# Patient Record
Sex: Male | Born: 1965
Health system: Southern US, Community
[De-identification: ages and names within clinical notes are randomized; demographics above are authoritative.]

## PROBLEM LIST (undated history)

## (undated) DIAGNOSIS — K219 Gastro-esophageal reflux disease without esophagitis: Secondary | ICD-10-CM

## (undated) DIAGNOSIS — M25339 Other instability, unspecified wrist: Secondary | ICD-10-CM

## (undated) DIAGNOSIS — Z87448 Personal history of other diseases of urinary system: Secondary | ICD-10-CM

## (undated) DIAGNOSIS — L290 Pruritus ani: Secondary | ICD-10-CM

## (undated) HISTORY — DX: Gastro-esophageal reflux disease without esophagitis: K21.9

## (undated) HISTORY — PX: LAPAROSCOPIC CHOLECYSTECTOMY: SUR755

## (undated) HISTORY — DX: Other instability, unspecified wrist: M25.339

## (undated) HISTORY — DX: Pruritus ani: L29.0

## (undated) HISTORY — DX: Personal history of other diseases of urinary system: Z87.448

---

## 2008-09-08 HISTORY — PX: RECTAL BOTOX INJECTION: SHX2304

## 2009-01-15 ENCOUNTER — Ambulatory Visit: Payer: Self-pay | Admitting: Family Medicine

## 2009-01-15 ENCOUNTER — Telehealth: Payer: Self-pay | Admitting: Family Medicine

## 2009-01-15 DIAGNOSIS — J069 Acute upper respiratory infection, unspecified: Secondary | ICD-10-CM | POA: Insufficient documentation

## 2009-01-15 DIAGNOSIS — K602 Anal fissure, unspecified: Secondary | ICD-10-CM | POA: Insufficient documentation

## 2009-01-15 DIAGNOSIS — K6289 Other specified diseases of anus and rectum: Secondary | ICD-10-CM | POA: Insufficient documentation

## 2009-01-15 DIAGNOSIS — R21 Rash and other nonspecific skin eruption: Secondary | ICD-10-CM | POA: Insufficient documentation

## 2009-01-22 ENCOUNTER — Telehealth: Payer: Self-pay | Admitting: Family Medicine

## 2009-01-23 ENCOUNTER — Encounter: Payer: Self-pay | Admitting: Family Medicine

## 2009-03-24 ENCOUNTER — Ambulatory Visit: Payer: Self-pay | Admitting: Family Medicine

## 2009-03-24 LAB — CONVERTED CEMR LAB

## 2009-03-26 ENCOUNTER — Encounter (INDEPENDENT_AMBULATORY_CARE_PROVIDER_SITE_OTHER): Payer: Self-pay | Admitting: *Deleted

## 2009-03-26 LAB — CONVERTED CEMR LAB
Basophils Relative: 1.4 % (ref 0.0–3.0)
Creatinine, Ser: 1.1 mg/dL (ref 0.4–1.5)
Eosinophils Absolute: 0.2 10*3/uL (ref 0.0–0.7)
Glucose, Bld: 78 mg/dL (ref 70–99)
HCT: 44.7 % (ref 39.0–52.0)
HDL: 33.2 mg/dL — ABNORMAL LOW (ref >39.00)
LDL Cholesterol: 94 mg/dL (ref 0–99)
Lymphs Abs: 2.8 10*3/uL (ref 0.7–4.0)
MCHC: 33.7 g/dL (ref 30.0–36.0)
MCV: 87.4 fL (ref 78.0–100.0)
Monocytes Absolute: 0.7 10*3/uL (ref 0.1–1.0)
Neutro Abs: 3.8 10*3/uL (ref 1.4–7.7)
Neutrophils Relative %: 48.8 % (ref 43.0–77.0)
RBC: 5.12 M/uL (ref 4.22–5.81)
Sodium: 141 meq/L (ref 135–145)
VLDL: 26.6 mg/dL (ref 0.0–40.0)

## 2009-10-26 ENCOUNTER — Ambulatory Visit: Payer: Self-pay | Admitting: Family Medicine

## 2009-10-26 DIAGNOSIS — M549 Dorsalgia, unspecified: Secondary | ICD-10-CM | POA: Insufficient documentation

## 2009-10-26 DIAGNOSIS — R5381 Other malaise: Secondary | ICD-10-CM | POA: Insufficient documentation

## 2009-10-26 DIAGNOSIS — R5383 Other fatigue: Secondary | ICD-10-CM

## 2009-10-26 DIAGNOSIS — R42 Dizziness and giddiness: Secondary | ICD-10-CM | POA: Insufficient documentation

## 2009-10-26 LAB — CONVERTED CEMR LAB: Vitamin B-12: 190 pg/mL — ABNORMAL LOW (ref 211–911)

## 2009-10-28 ENCOUNTER — Telehealth: Payer: Self-pay | Admitting: Family Medicine

## 2009-10-28 LAB — CONVERTED CEMR LAB: Vit D, 25-Hydroxy: 24 ng/mL — ABNORMAL LOW

## 2009-11-18 ENCOUNTER — Telehealth: Payer: Self-pay | Admitting: Family Medicine

## 2010-03-26 ENCOUNTER — Telehealth (INDEPENDENT_AMBULATORY_CARE_PROVIDER_SITE_OTHER): Payer: Self-pay | Admitting: *Deleted

## 2010-03-30 ENCOUNTER — Ambulatory Visit: Payer: Self-pay | Admitting: Family Medicine

## 2010-03-30 DIAGNOSIS — E559 Vitamin D deficiency, unspecified: Secondary | ICD-10-CM | POA: Insufficient documentation

## 2010-03-30 DIAGNOSIS — E538 Deficiency of other specified B group vitamins: Secondary | ICD-10-CM | POA: Insufficient documentation

## 2010-03-30 LAB — CONVERTED CEMR LAB
AST: 19 units/L (ref 0–37)
Albumin: 4.1 g/dL (ref 3.5–5.2)
Alkaline Phosphatase: 45 units/L (ref 39–117)
Basophils Relative: 0.6 % (ref 0.0–3.0)
Bilirubin, Direct: 0.1 mg/dL (ref 0.0–0.3)
CO2: 28 meq/L (ref 19–32)
Chloride: 105 meq/L (ref 96–112)
Eosinophils Relative: 3.2 % (ref 0.0–5.0)
Glucose, Bld: 93 mg/dL (ref 70–99)
HCT: 43.9 % (ref 39.0–52.0)
Lymphs Abs: 2.5 10*3/uL (ref 0.7–4.0)
MCV: 88.7 fL (ref 78.0–100.0)
Monocytes Absolute: 0.5 10*3/uL (ref 0.1–1.0)
Monocytes Relative: 7.3 % (ref 3.0–12.0)
Neutrophils Relative %: 49.3 % (ref 43.0–77.0)
PSA: 0.74 ng/mL (ref 0.10–4.00)
Platelets: 229 10*3/uL (ref 150.0–400.0)
Potassium: 4.3 meq/L (ref 3.5–5.1)
RBC: 4.95 M/uL (ref 4.22–5.81)
Sodium: 140 meq/L (ref 135–145)
TSH: 2.61 microintl units/mL (ref 0.35–5.50)
Total CHOL/HDL Ratio: 4
Total Protein: 6.5 g/dL (ref 6.0–8.3)
Vit D, 25-Hydroxy: 40 ng/mL (ref 30–89)
WBC: 6.3 10*3/uL (ref 4.5–10.5)

## 2010-04-01 ENCOUNTER — Ambulatory Visit: Payer: Self-pay | Admitting: Family Medicine

## 2010-06-07 ENCOUNTER — Ambulatory Visit: Payer: Self-pay | Admitting: Internal Medicine

## 2010-06-07 LAB — CONVERTED CEMR LAB: Rapid Strep: NEGATIVE

## 2010-08-10 NOTE — Assessment & Plan Note (Signed)
Summary: BACK PAIN,DIZZY/CLE   Vital Signs:  Patient profile:   45 year old male Height:      66 inches Weight:      192 pounds BMI:     31.10 Temp:     98.1 degrees F oral Pulse rate:   72 / minute Pulse rhythm:   regular BP sitting:   120 / 88  (left arm) Cuff size:   regular  Vitals Entered By: Benny Lennert CMA Duncan Dull) (October 26, 2009 10:03 AM)  History of Present Illness: Chief complaint back pain, dizziness, and sinus issues  45 year old male:  1. Dizziness Feeling lightheaded at times Does not feel like will fall down No fixed time Happens No changes in position bothers it Lasts short time   2. Side pain, back pain: Still having some pain off and on no radiculopathy  3. Fatigue Feeling tired up to sixty hours a week at least six hours of sleep a night  4. URI: Having some cough and cold right now some sinus pressure no st  Doing ellipitcal exercises, did for about two weeks.    Allergies (verified): No Known Drug Allergies  Past History:  Past medical, surgical, family and social histories (including risk factors) reviewed, and no changes noted (except as noted below).  Past Medical History: Reviewed history from 01/15/2009 and no changes required. Anal fissure Chronic anal itching h/o hematuria (neg cystoscopy, rads eval)    Past Surgical History: Reviewed history from 01/15/2009 and no changes required. botox for fissure mar 2010  Colonoscopy 2009  Family History: Reviewed history from 01/15/2009 and no changes required. Family History High cholesterol  Social History: Reviewed history from 01/15/2009 and no changes required. 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 No apparent alopecia or balding. Ears:  External ear exam shows no significant lesions or deformities.  Otoscopic examination reveals clear canals, tympanic membranes are intact bilaterally without bulging, retraction, inflammation or discharge. Hearing is grossly normal bilaterally. Nose:  External nasal examination shows no deformity or inflammation. Nasal mucosa are pink and moist without lesions or exudates. Mouth:  Oral mucosa and oropharynx without lesions or exudates.  Teeth in good repair. Neck:  No deformities, masses, or tenderness noted. Lungs:  Normal respiratory effort, chest expands symmetrically. Lungs are clear to auscultation, no crackles or wheezes. Heart:  Normal rate and regular rhythm. S1 and S2 normal without gallop, murmur, click, rub or other extra sounds. Abdomen:  Bowel sounds positive,abdomen soft and non-tender without masses, organomegaly or hernias noted. Msk:  TTP L deep oblique Neg slr NT paraspinus Cervical Nodes:  No lymphadenopathy noted Psych:  Cognition and judgment appear intact. Alert and cooperative with normal attention span and concentration. No apparent delusions, illusions, hallucinations   Impression & Recommendations:  Problem # 1:  FATIGUE (ICD-780.79) Assessment New basic workup labs last reviewed  Orders: Venipuncture (16109) TLB-B12 + Folate Pnl (60454_09811-B14/NWG) TLB-TSH (Thyroid Stimulating Hormone) (84443-TSH) T-Vitamin D (25-Hydroxy) (95621-30865)  Problem # 2:  BACK PAIN (ICD-724.5) Assessment: New Lose weight core program  Alleve 2 tabs by mouth two times a day over the counter, take at least for 2 - 3 weeks. This is a prescription strength dose.   Problem # 3:  URI (ICD-465.9) Assessment: New  Instructed on symptomatic treatment. Call if symptoms persist or worsen.   Problem # 4:  DIZZINESS (ICD-780.4)  rare lightheadednes no red flags, no palpitations  Current  Allergies (reviewed today): No known allergies   Appended Document: Orders Update    Clinical Lists Changes  Orders: Added new Service order of Specimen Handling (84166) - Signed Added new Test order of T-Vitamin D (25-Hydroxy) (06301-60109) - Signed

## 2010-08-10 NOTE — Progress Notes (Signed)
Summary: problem with reflux  Phone Note Call from Patient Call back at Home Phone [phone]   Caller: Patient Call For: Hannah Beat MD Summary of Call: Pt states he has had problems with reflux for about a week.  He has been taking pepcid, which helps.  He is asking if he should continue that or try something else otc or a prescription.  He also asks if the bumps in his mouth could be caused by the reflux.  [facility] Initial call taken by: Lowella Petties CMA,  October 28, 2009 8:26 AM  Follow-up for Phone Call        No, he has apthous ulcer. Caused by virus.  Can use pepcid or zantac otc. If they help if not, get prilosec or prevacid otc Follow-up by: Hannah Beat MD,  October 28, 2009 8:52 AM  Additional Follow-up for Phone Call Additional follow up Details #1::        Advised pt. Additional Follow-up by: Lowella Petties CMA,  October 28, 2009 9:33 AM

## 2010-08-10 NOTE — Assessment & Plan Note (Signed)
Summary: FELL OFF LADDER YESTERDAY/CLE   Vital Signs:  Patient profile:   45 year old male Height:      66 inches Weight:      189.50 pounds BMI:     30.70 Temp:     99.1 degrees F oral Pulse rate:   66 / minute Pulse rhythm:   regular BP sitting:   120 / 82  (left arm) Cuff size:   large  Vitals Entered By: Melody Comas (June 07, 2010 3:03 PM) CC: fell off of ladder/ throat hurts    History of Present Illness: CC: fell off ladder, sore throat  DOI: 06/06/2010  1. fell off ladder yesterday, about 6 ft.  Grabbed onto tree branch, + scrapes and abrasions.  Now noticing tenderness L wrist.  Worse with grabbing with hand.  Also some swelling dorsal wrist.  landed on L wrist.  2. ST - 2-3 d h/o ST, with painful swallowing.  No fevers/chills, abd pain, n/v/d, rashes, other myalgia/arthralgia.  No cough, congestion, sneezing.  No HA, sinus pressure.  No sick contacts at home.  No smokers at home.  requests tetanus, unsure when last one was, no mention in chart.  states had flu this year at work.  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 No apparent alopecia or balding.  no sinus tenderness Eyes:  No corneal or conjunctival inflammation noted. EOMI. Perrla. Vision grossly normal. Ears:  TMs clear Nose:  nares clear Mouth:  some pharyngeal erythema, no exudates.  Teeth in  good repair. Neck:  No deformities, masses, or tenderness noted. Lungs:  Normal respiratory effort, chest expands symmetrically. Lungs are clear to auscultation, no crackles or wheezes. Heart:  Normal rate and regular rhythm. S1 and S2 normal without gallop, murmur, click, rub or other extra sounds. Msk:  grip strenght intact bilaterally.  + swelling/?effusion L dorsal carpals over radiocarpal ligament.  max tenderness with L wrist flexion against resistance,  no anatomic snuffbox tenderness.  FROM otherwise Pulses:  2+ rad pulses, brisk cap refill Neurologic:  sensation intact, strength intact Skin:  scratches/abrasions throughout BUE and R abd    Impression & Recommendations:  Problem # 1:  WRIST PAIN, LEFT (ICD-719.43) obtain xray given swelling to eval carpal fx and scapholunate dissociation.  negative.  likely radiocarpal ligament sprain.  supportive care - rest, ice, NSAIDs, ace bandage at night.  return if not improving as expected or worsening. Orders: T-Wrist Comp Left Min 3 Views (73110TC)  Problem # 2:  SORE THROAT (ICD-462) suportive care.  likely viral.  neg strep, low centor criteria. His updated medication list for this problem includes:    Naprosyn 500 Mg Tabs (Naproxen) .Marland Kitchen... Take one by mouth two times a day as needed pain with food  Complete Medication List: 1)  Naprosyn 500 Mg Tabs (Naproxen) .... Take one by mouth two times a day as needed pain with food  Other Orders: Tdap => 63yrs IM (16109) Admin 1st Vaccine (60454)  Patient Instructions: 1)  For wrist - xrays are looking ok.  if any change in reading, i will call you at [phone].  ice 15 min 2-3 x/day, rest for next few days.  Anti inflammatory as prescribed. 2)  For throat - Looks like a viral infection.  Wash hands to prevent spreading. 3)  Push fluids, get plenty of rest, ibuprofen (motrin) for sore throat.  Suck on cold things like popsicles, or heat to soothe throat like herbal teas, consider salt water  gargles. 4)  If fever >101.5, trouble swallowing or breathing or opening mouth, drooling, or other concerns, you may need to return to be seen. 5)  Pleasure to see you today, call clinic with questions.  Prescriptions: NAPROSYN 500 MG TABS (NAPROXEN) take one by mouth two times a day as needed pain with food  #30 x 0   Entered and Authorized by:   Eustaquio Boyden  MD   Signed by:   Eustaquio Boyden  MD on 06/07/2010   Method used:   Electronically to        [facility] [phone]* (retail)       [address]       [place], [place]  [address]       Ph: [phone] or [phone]       Fax: [phone]   RxID:   [id number]    Orders Added: 1)  Tdap => 62yrs IM [90715] 2)  Admin 1st Vaccine [90471] 3)  T-Wrist Comp Left Min 3 Views [73110TC] 4)  Est. Patient Level III [75643]   Immunizations Administered:  Tetanus Vaccine:    Vaccine Type: Tdap    Site: right deltoid    Mfr: GlaxoSmithKline    Dose: 0.5 ml    Route: IM    Given by: Melody Comas    Exp. Date: 05/02/2012    Lot #: [id number]    VIS given: 05/28/08 version given June 07, 2010.   Immunizations Administered:  Tetanus Vaccine:    Vaccine Type: Tdap    Site: right deltoid    Mfr: GlaxoSmithKline    Dose: 0.5 ml    Route: IM    Given by: Melody Comas    Exp. Date: 05/02/2012    Lot #: [id number]    VIS given: 05/28/08 version given June 07, 2010.  Prior Medications (reviewed today): None Current Allergies (reviewed today): No known allergies   Laboratory Results  Date/Time Received: June 07, 2010 3:13 PM Date/Time Reported: June 07, 2010 3:13 PM  Other Tests  Rapid Strep: negative     Prevention & Chronic Care Immunizations   Influenza vaccine: given per patient  (04/24/2010)    Tetanus booster: 06/07/2010: Tdap    Pneumococcal vaccine: Not documented  Other Screening   Smoking status: quit  (04/01/2010)  Lipids   Total Cholesterol: 136  (03/30/2010)    LDL: 86  (03/30/2010)   LDL Direct: Not documented   HDL: 32.70  (03/30/2010)   Triglycerides: 87.0  (03/30/2010)

## 2010-08-10 NOTE — Progress Notes (Signed)
Summary: pt requests meds for back pain  Phone Note Call from Patient Call back at Home Phone [phone]   Caller: Patient Summary of Call: Pt states he has mid low back pain.  Injured his back trying to start a weed eater.  He is asking if he can have scripts for voltaren and robaxin.  He has had these in the past and they worked for him.  [facility] [address] [facility] [place]. Initial call taken by: Lowella Petties CMA,  Nov 18, 2009 12:04 PM  Follow-up for Phone Call        Await recs from [place] Follow-up by: Kerby Nora MD,  Nov 18, 2009 12:32 PM  Additional Follow-up for Phone Call Additional follow up Details #1::        I think that is very reasonable - I know this patient well.  Rest, use some moist heat like a heating pad or bath and gentle stretching. Additional Follow-up by: Hannah Beat MD,  Nov 18, 2009 3:25 PM    Additional Follow-up for Phone Call Additional follow up Details #2::    Advised pt.       Lowella Petties CMA  Nov 18, 2009 3:52 PM   New/Updated Medications: DICLOFENAC SODIUM 75 MG TBEC (DICLOFENAC SODIUM) 1 by mouth bid. take with food METHOCARBAMOL 500 MG TABS (METHOCARBAMOL) 1 by mouth three times a day as needed muscle spasm Prescriptions: METHOCARBAMOL 500 MG TABS (METHOCARBAMOL) 1 by mouth three times a day as needed muscle spasm  #40 x 0   Entered and Authorized by:   Hannah Beat MD   Signed by:   Hannah Beat MD on 11/18/2009   Method used:   Electronically to        [facility] [phone]* (retail)       [address]       [place], [place]  [address]       Ph: [phone] or [phone]       Fax: [phone]   RxID:   [id number] DICLOFENAC SODIUM 75 MG TBEC (DICLOFENAC SODIUM) 1 by mouth bid. take with food  #60 x 1   Entered and Authorized by:   Hannah Beat MD   Signed by:   Hannah Beat MD on 11/18/2009   Method used:   Electronically to        [facility] [phone]* (retail)       [address]  [place], [place]  [address]       Ph: [phone] or [phone]       Fax: [phone]   RxID:   [id number]

## 2010-08-10 NOTE — Assessment & Plan Note (Signed)
Summary: CPX/CLE   Vital Signs:  Patient profile:   45 year old male Height:      66 inches Weight:      186 pounds Temp:     98.7 degrees F oral Pulse rate:   68 / minute Pulse rhythm:   regular BP sitting:   130 / 82  (left arm) Cuff size:   regular  Vitals Entered By: Sydell Axon LPN (April 01, 2010 8:27 AM) CC: 30 Minute checkup, body aches in the mornings   History of Present Illness: 45 year old male:  Painful coming down from  10-15 mins L knee, about seven or eight    B12 level slightly low  has intermittent balanitis - now using combo lotrimin and steroid cream   General: Denies fever, chills, sweats, anorexia, fatigue - SOMEWHAT TIRED. GETS TO WORK BEFORE 7 AM AND LEAVES AROUND 7 PM, weakness, malaise Eyes: Denies blurring, vision loss ENT: Denies earache, nasal congestion, nosebleeds, sore throat, and hoarseness.  Cardiovascular: Denies chest pains, palpitations, syncope, dyspnea on exertion,  Respiratory: Denies cough, dyspnea at rest, excessive sputum,wheeezing GI: Denies nausea, vomiting, diarrhea, constipation, change in bowel habits, abdominal pain, melena, hematochezia GU: Denies dysuria, hematuria, AS ABOVE,  urinary frequency, urinary hesitancy, nocturia, incontinence, genital sores, decreased libido Musculoskeletal: INTERMITTENT JOINT PAINS, NO SYNOVITIS, NO WARMTH, ANTERIOR KNEE PAIN WITH GOING UP AND DOWN STAIRS Derm: OCC BALANITIS Neuro: Denies  paresthesias, frequent falls, frequent headaches, and difficulty walking.  Psych: Denies depression, anxiety Endocrine: Denies cold intolerance, heat intolerance, polydipsia, polyphagia, polyuria, and unusual weight change.  Heme: Denies enlarged lymph nodes Allergy: No hayfever   Otherwise, the pertinent positives and negatives are listed above and in the HPI, otherwise a full review of systems has been reviewed and is negative unless noted positive.   Preventive Screening-Counseling &  Management  Alcohol-Tobacco     Alcohol drinks/day: <1     Alcohol Counseling: not indicated; patient does not drink     Smoking Status: quit     Smoke Cessation Stage: quit     Year Quit: 2009     Tobacco Counseling: to remain off tobacco products  Caffeine-Diet-Exercise     Diet Comments: fair     Diet Counseling: to improve diet; diet is suboptimal     Does Patient Exercise: no     Type of exercise: rare, some activity, biking     Exercise Counseling: to improve exercise regimen  Hep-HIV-STD-Contraception     Hepatitis Risk: no risk noted     HIV Risk: no risk noted     STD Risk: no risk noted     STD Risk Counseling: not indicated-no STD risk noted     Contraception Counseling: not indicated; no questions/concerns expressed     Dental Visit-last 6 months no     Dental Care Counseling: to seek dental care; no dental care within six months     Testicular SE Education/Counseling to perform regular STE     Sun Exposure-Excessive: no  Safety-Violence-Falls     Violence in the Home: no risk noted     Fall Risk: no      Sexual History:  currently monogamous.        Drug Use:  never.        Travel History:  [place].    Clinical Review Panels:  Prevention   Last PSA:  0.74 (03/30/2010)  Lipid Management   Cholesterol:  136 (03/30/2010)   LDL (bad choesterol):  86 (03/30/2010)  HDL (good cholesterol):  32.70 (03/30/2010)  CBC   WBC:  6.3 (03/30/2010)   RBC:  4.95 (03/30/2010)   Hgb:  14.7 (03/30/2010)   Hct:  43.9 (03/30/2010)   Platelets:  229.0 (03/30/2010)   MCV  88.7 (03/30/2010)   MCHC  33.5 (03/30/2010)   RDW  13.3 (03/30/2010)   PMN:  49.3 (03/30/2010)   Lymphs:  39.6 (03/30/2010)   Monos:  7.3 (03/30/2010)   Eosinophils:  3.2 (03/30/2010)   Basophil:  0.6 (03/30/2010)  Complete Metabolic Panel   Glucose:  93 (03/30/2010)   Sodium:  140 (03/30/2010)   Potassium:  4.3 (03/30/2010)   Chloride:  105 (03/30/2010)   CO2:  28 (03/30/2010)   BUN:  13  (03/30/2010)   Creatinine:  1.1 (03/30/2010)   Albumin:  4.1 (03/30/2010)   Total Protein:  6.5 (03/30/2010)   Calcium:  9.5 (03/30/2010)   Total Bili:  1.0 (03/30/2010)   Alk Phos:  45 (03/30/2010)   SGPT (ALT):  12 (03/30/2010)   SGOT (AST):  19 (03/30/2010)   Allergies: No Known Drug Allergies  Past History:  Past medical, surgical, family and social histories (including risk factors) reviewed, and no changes noted (except as noted below).  Past Medical History: Reviewed history from 01/15/2009 and no changes required. Anal fissure Chronic anal itching h/o hematuria (neg cystoscopy, rads eval)    Past Surgical History: Reviewed history from 01/15/2009 and no changes required. botox for fissure mar 2010  Colonoscopy 2009  Family History: Reviewed history from 01/15/2009 and no changes required. Family History High cholesterol  Social History: Reviewed history from 01/15/2009 and no changes required. 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 No apparent alopecia or balding. Eyes:  No corneal or conjunctival inflammation noted. EOMI. Perrla. Vision grossly normal. Ears:  External ear exam shows no significant lesions or deformities.  Otoscopic examination reveals clear canals, tympanic membranes are intact bilaterally without bulging, retraction, inflammation or discharge. Hearing is grossly normal bilaterally. Nose:  External nasal examination shows no deformity or inflammation. Nasal mucosa are pink and moist without lesions or exudates. Mouth:  Oral mucosa and oropharynx without lesions or exudates.  Teeth in good repair. Neck:  No deformities, masses, or tenderness noted. Chest Wall:  No  deformities, masses, tenderness or gynecomastia noted. Lungs:  Normal respiratory effort, chest expands symmetrically. Lungs are clear to auscultation, no crackles or wheezes. Heart:  Normal rate and regular rhythm. S1 and S2 normal without gallop, murmur, click, rub or other extra sounds. Abdomen:  Bowel sounds positive,abdomen soft and non-tender without masses, organomegaly or hernias noted. Rectal:  No external abnormalities noted. Normal sphincter tone. No rectal masses or tenderness. Genitalia:  Testes bilaterally descended without nodularity, tenderness or masses. No scrotal masses or lesions. No penis lesions or urethral discharge. Prostate:  Prostate gland firm and smooth, no enlargement, nodularity, tenderness, mass, asymmetry or induration. Msk:  Gait: Normal heel toe pattern ROM: WNL Effusion: neg Echymosis or edema: none Patellar tendon NT Painful PLICA: neg Grind: pain with inferior and superior polar compression Medial and lateral patellar facet loading: painful NT medial and lateral joint lines Mcmurray's neg Flexion-pinch neg Varus and valgus stress: stable Lachman: neg VMO atrophy: mild Hamstring concentric and eccentric: 5/5   Extremities:  No clubbing, cyanosis, edema, or deformity noted with normal full range of  motion of all joints.   Neurologic:  alert & oriented X3.   Skin:  Intact without suspicious lesions or rashes Cervical Nodes:  No lymphadenopathy noted Inguinal Nodes:  No significant adenopathy Psych:  Cognition and judgment appear intact. Alert and cooperative with normal attention span and concentration. No apparent delusions, illusions, hallucinations   Impression & Recommendations:  Problem # 1:  HEALTH MAINTENANCE EXAM (ICD-V70.0) The patient's preventative maintenance and recommended screening tests for an annual wellness exam were reviewed in full today. Brought up to date unless services declined.  Counselled on the importance of diet,  exercise, and its role in overall health and mortality. The patient's FH and SH was reviewed, including their home life, tobacco status, and drug and alcohol status.   start b12. 1000 cont vit d needs to exercise and loose weight discussed pfs pain  Complete Medication List: 1)  Methocarbamol 500 Mg Tabs (Methocarbamol) .Marland Kitchen.. 1 by mouth three times a day as needed muscle spasm  Patient Instructions: 1)  CLOTRIMAZOLE cream 2)  B12 - 1000 units a day  Current Allergies (reviewed today): No known allergies

## 2010-08-10 NOTE — Progress Notes (Signed)
----   Converted from flag ---- ---- 03/26/2010 5:51 AM, Hannah Beat MD wrote: Prephysical Labs, several days before, fasting BMP, HFP, FLP, CBC with diff, TSH, PSA: v77.91, v77.1, ,780.79, v76.44  Vit D: Vitamin D deficiency Vit b12: Vit b12 deficiency  please look up codes for those 2 vits for me.   Thanks.  ---- 03/25/2010 9:42 AM, Liane Comber CMA (AAMA) wrote: Lab orders please! Good Morning! This pt is scheduled for cpx labs Tuesday, which labs to draw and dx codes to use? Thanks Tasha ------------------------------

## 2010-12-29 ENCOUNTER — Encounter: Payer: Self-pay | Admitting: Family Medicine

## 2010-12-30 ENCOUNTER — Ambulatory Visit (INDEPENDENT_AMBULATORY_CARE_PROVIDER_SITE_OTHER): Payer: BC Managed Care – PPO | Admitting: Family Medicine

## 2010-12-30 ENCOUNTER — Encounter: Payer: Self-pay | Admitting: Family Medicine

## 2010-12-30 DIAGNOSIS — M222X9 Patellofemoral disorders, unspecified knee: Secondary | ICD-10-CM

## 2010-12-30 DIAGNOSIS — M25569 Pain in unspecified knee: Secondary | ICD-10-CM

## 2010-12-30 DIAGNOSIS — M25539 Pain in unspecified wrist: Secondary | ICD-10-CM

## 2010-12-30 NOTE — Progress Notes (Signed)
Roger Ferguson, a 45 y.o. male presents today in the office for the following:    Knee pain and wrist pain:  Last fall, fell and still remains aving pain in the dorsum of his wrist.  The patient fell with an extended hand on the left, and in a hand extended position fell on his hand after getting tangled up in a ladder. Since that time he has had wrist pain that is not resolved. Initially, the patient was evaluated in November, and had plain films which were negative. He however has had persistent pain. He rates his pain on a 3-4/10 pain scale. Primarily occurs with twisting motions at the wrist. Will be a mechanical shift and collect in the wrist occasionally. No pain in the volar posterior aspect of the forearm. The pain in the fingers. No particular swelling.  Some anterior knee pain.   Patient presents with longstanding h/o B sided knee pain after no occult injury. No audible pop was heard. The patient has not had an effusion. No symptomatic giving-way. No mechanical clicking. Joint has not locked up. Patient has been able to walk but is limping. The patient does have pain going up and down stairs or rising from a seated position.   Pain location: anterior Current physical activity: ellipitical some Prior Knee Surgery: none Current pain meds: none Bracing: none  The PMH, PSH, Social History, Family History, Medications, and allergies have been reviewed in [facility], and have been updated if relevant.  ROS: no acute illness or fever MSK: above GI: tol po intake without nausea or vomitting. Neuro: no numbness, tingling, or radiculopathy O/w per hpi  PHYSICAL EXAM  Blood pressure 130/80, pulse 80, temperature 97.8 F (36.6 C), temperature source Oral, height 5\' 6"  (1.676 m), weight 179 lb 1.9 oz (81.248 kg), SpO2 99.00%.  GEN: Well-developed,well-nourished,in no acute distress; alert,appropriate and cooperative throughout examination HEENT: Normocephalic and atraumatic without obvious  abnormalities. Ears, externally no deformities PULM: Breathing comfortably in no respiratory distress EXT: No clubbing, cyanosis, or edema PSYCH: Normally interactive. Cooperative during the interview. Pleasant. Friendly and conversant. Not anxious or depressed appearing. Normal, full affect.  Left hand: Nontender throughout all digits. Nontender at all MCP joints. Nontender throughout the distal radius and distal ulna. Nontender with compression of the distal radial ulnar joint. Nontender at the TFCC. Axial loading is grossly nontender. Ulnar deviation is nontender. Radial deviation causes some pain. Resisted radial deviation causes significant pain. Resisted supplemention or pronation also causes some pain. Shuck maneuver causes some pain.  Gait: Normal heel toe pattern ROM: WNL Effusion: neg Echymosis or edema: none Patellar tendon NT Painful PLICA: neg Patellar grind: POS Medial and lateral patellar facet loading: negative medial and lateral joint lines:NT Mcmurray's neg Flexion-pinch neg Varus and valgus stress: stable Lachman: neg Ant and Post drawer: neg Hip abduction, IR, ER: WNL Hip flexion str: 5/5 Hip abd: 5/5 Quad: 5/5 VMO atrophy:MILD Hamstring concentric and eccentric: 5/5  A/P:  1. wrist pain, left. Clinical concern the patient may have partially versus full ruptured one of his carpal ligaments. This point, we discussed various treatment options conservative versus more aggressive, and we would like to do a trial of conservative treatment for one month, and then recheck his situation. Intra-articular injection and then immobilized for several weeks. Then begin rehabilitation process and reexamine.  Injection, Intraarticular Wrist, L Patient verbally consented for procedure; risks, benefits, and alternatives explained. Patient prepped with betadine. Ethyl chloride used for anesthesia. Using sterile technique, just distal to Lister's  tubercle, using 3 cc syringe with 22  gauge needle, needle inserted and aspirated, no blood. Then 1/2 cc Lidocaine 1% and 1/2 cc Kenalog 40 mg (equivalent to Kenalog 20 mg) injected without difficulty into wrist.  Pressue applied, minimal blood. Tolerated well, decreased pain, no complications.   2. Knee Pain, PFS: Given rehab exercises handout for VMO, hip abductors, core, entire kinetic chain including proprioception exercises including cone touches, step downs, hip elevations and turn outs.  Could benefit from PT, regular exercise, upright biking, and a PFS knee brace to assist with tracking abnormalities.

## 2010-12-30 NOTE — Patient Instructions (Signed)
Wear wrist brace for the next 3 weeks, then titrate out of it. Then start to use again Stress Radio producer bands  Patellofemoral Syndrome Rehab  Isometric contractions of thigh - 10 x 10 secs  3 way straight leg raises - build to 3 sets of 30 and then add weights begin with no weight. When 3 x 30 reached, Add 2 lb. ankle weight. Increase to 3,4,5,6 when 3x30 achieved.  Drop squats - limit to 45 deg, 3x15  Modified lunge - running position, 3x15  Seated quad extensions, 3x15, add ankle weights  Step downs, 3x15 with body weight slowly on downward phase  Knee up and open hip: knee up and externally rotate hip to open position, hold 2 sec and repeat each leg, 30 reps  Cone Drills: Right Leg, Right Hand Right Leg, Left Hand Left Leg, Right Hand Left Leg, Left Hand Start with 1 cone, progress to 3 20 each exercise

## 2011-02-10 ENCOUNTER — Encounter: Payer: Self-pay | Admitting: Family Medicine

## 2011-02-10 ENCOUNTER — Ambulatory Visit (INDEPENDENT_AMBULATORY_CARE_PROVIDER_SITE_OTHER): Payer: BC Managed Care – PPO | Admitting: Family Medicine

## 2011-02-10 VITALS — BP 110/80 | HR 81 | Temp 98.4°F | Wt 173.4 lb

## 2011-02-10 DIAGNOSIS — M25569 Pain in unspecified knee: Secondary | ICD-10-CM

## 2011-02-10 DIAGNOSIS — M25539 Pain in unspecified wrist: Secondary | ICD-10-CM

## 2011-02-10 DIAGNOSIS — M2550 Pain in unspecified joint: Secondary | ICD-10-CM

## 2011-02-10 DIAGNOSIS — M222X9 Patellofemoral disorders, unspecified knee: Secondary | ICD-10-CM

## 2011-02-10 LAB — SEDIMENTATION RATE: Sed Rate: 9 mm/hr (ref 0–22)

## 2011-02-10 NOTE — Progress Notes (Signed)
Roger Ferguson, a 45 y.o. male presents today in the office for the following:    Left wrist: No pain now, much, much less pain. Wore splint even when going to bed S/p wrist injection Now feeling much better L wrist, improved  B PFS: classic PFS symptoms have continued, anterior knee pain, worse with getting up in the morning, rising, going up and down stairs. He has lost 20 pounds, and he is using the elliptical machine.   Polyathralgia and back pain, patient has concerns about potential systemic rheum d/o. No FH. No hot, swollen joints.  12/30/2010 Last fall, fell and still remains aving pain in the dorsum of his wrist.  The patient fell with an extended hand on the left, and in a hand extended position fell on his hand after getting tangled up in a ladder. Since that time he has had wrist pain that is not resolved. Initially, the patient was evaluated in November, and had plain films which were negative. He however has had persistent pain. He rates his pain on a 3-4/10 pain scale. Primarily occurs with twisting motions at the wrist. Will be a mechanical shift and collect in the wrist occasionally. No pain in the volar posterior aspect of the forearm. The pain in the fingers. No particular swelling.  Some anterior knee pain.   Patient presents with longstanding h/o B sided knee pain after no occult injury. No audible pop was heard. The patient has not had an effusion. No symptomatic giving-way. No mechanical clicking. Joint has not locked up. Patient has been able to walk but is limping. The patient does have pain going up and down stairs or rising from a seated position.   Pain location: anterior Current physical activity: ellipitical some Prior Knee Surgery: none Current pain meds: none Bracing: none  The PMH, PSH, Social History, Family History, Medications, and allergies have been reviewed in [facility], and have been updated if relevant.  ROS: no acute illness or fever MSK: above GI: tol  po intake without nausea or vomitting. Neuro: no numbness, tingling, or radiculopathy O/w per hpi  PHYSICAL EXAM  Blood pressure 110/80, pulse 81, temperature 98.4 F (36.9 C), temperature source Oral, weight 173 lb 6.4 oz (78.654 kg), SpO2 98.00%.   GEN: Well-developed,well-nourished,in no acute distress; alert,appropriate and cooperative throughout examination HEENT: Normocephalic and atraumatic without obvious abnormalities. Ears, externally no deformities PULM: Breathing comfortably in no respiratory distress EXT: No clubbing, cyanosis, or edema PSYCH: Normally interactive. Cooperative during the interview. Pleasant. Friendly and conversant. Not anxious or depressed appearing. Normal, full affect.  Left hand: Nontender throughout all digits. Nontender at all MCP joints. Nontender throughout the distal radius and distal ulna. Nontender with compression of the distal radial ulnar joint. Nontender at the TFCC. Axial loading is grossly nontender. Ulnar deviation is nontender. NT with all motions at the knee  Knee, crepitus, patellar with compression and motion B knees.   A/P:  1. Arthralgia  ANA, Sedimentation rate, C-reactive protein, Rheumatoid factor, Cyclic citrul peptide antibody, IgG  2. Patellofemoral syndrome    3. Wrist pain     Rehab protocols for PFS reviewed - lost info last time Hand rehab reviewed  Reasonable to check basic rheum panels

## 2011-02-11 LAB — ANA: Anti Nuclear Antibody(ANA): NEGATIVE

## 2011-02-14 ENCOUNTER — Encounter: Payer: Self-pay | Admitting: *Deleted

## 2011-02-14 LAB — CYCLIC CITRUL PEPTIDE ANTIBODY, IGG: Cyclic Citrullin Peptide Ab: <2 U/mL (ref 0.0–5.0)

## 2011-06-23 ENCOUNTER — Other Ambulatory Visit: Payer: Self-pay | Admitting: Family Medicine

## 2011-06-23 DIAGNOSIS — Z79899 Other long term (current) drug therapy: Secondary | ICD-10-CM

## 2011-06-23 DIAGNOSIS — Z13220 Encounter for screening for lipoid disorders: Secondary | ICD-10-CM

## 2011-06-23 DIAGNOSIS — Z125 Encounter for screening for malignant neoplasm of prostate: Secondary | ICD-10-CM

## 2011-06-23 DIAGNOSIS — E559 Vitamin D deficiency, unspecified: Secondary | ICD-10-CM

## 2011-06-23 DIAGNOSIS — E539 Vitamin B deficiency, unspecified: Secondary | ICD-10-CM

## 2011-06-29 ENCOUNTER — Other Ambulatory Visit (INDEPENDENT_AMBULATORY_CARE_PROVIDER_SITE_OTHER): Payer: BC Managed Care – PPO

## 2011-06-29 DIAGNOSIS — E539 Vitamin B deficiency, unspecified: Secondary | ICD-10-CM

## 2011-06-29 DIAGNOSIS — Z125 Encounter for screening for malignant neoplasm of prostate: Secondary | ICD-10-CM

## 2011-06-29 DIAGNOSIS — E559 Vitamin D deficiency, unspecified: Secondary | ICD-10-CM

## 2011-06-29 DIAGNOSIS — Z13220 Encounter for screening for lipoid disorders: Secondary | ICD-10-CM

## 2011-06-29 DIAGNOSIS — Z79899 Other long term (current) drug therapy: Secondary | ICD-10-CM

## 2011-06-29 LAB — COMPREHENSIVE METABOLIC PANEL
ALT: 8 U/L (ref 0–53)
AST: 14 U/L (ref 0–37)
CO2: 31 mEq/L (ref 19–32)
Calcium: 9.8 mg/dL (ref 8.4–10.5)
Chloride: 106 mEq/L (ref 96–112)
Creatinine, Ser: 1.1 mg/dL (ref 0.4–1.5)
GFR: 76.65 mL/min (ref >60.00)
Sodium: 143 mEq/L (ref 135–145)
Total Protein: 6.7 g/dL (ref 6.0–8.3)

## 2011-06-29 LAB — CBC WITH DIFFERENTIAL/PLATELET
Basophils Absolute: 0 10*3/uL (ref 0.0–0.1)
Eosinophils Absolute: 0.2 10*3/uL (ref 0.0–0.7)
Lymphocytes Relative: 29.3 % (ref 12.0–46.0)
MCHC: 34.1 g/dL (ref 30.0–36.0)
Monocytes Relative: 6.1 % (ref 3.0–12.0)
Neutro Abs: 4.4 10*3/uL (ref 1.4–7.7)
Neutrophils Relative %: 61.1 % (ref 43.0–77.0)
Platelets: 244 10*3/uL (ref 150.0–400.0)
RDW: 13 % (ref 11.5–14.6)

## 2011-06-29 LAB — LIPID PANEL
Cholesterol: 164 mg/dL (ref 0–200)
HDL: 39.3 mg/dL (ref >39.00)
LDL Cholesterol: 100 mg/dL — ABNORMAL HIGH (ref 0–99)
Total CHOL/HDL Ratio: 4
Triglycerides: 124 mg/dL (ref 0.0–149.0)
VLDL: 24.8 mg/dL (ref 0.0–40.0)

## 2011-06-30 LAB — VITAMIN D 25 HYDROXY (VIT D DEFICIENCY, FRACTURES): Vit D, 25-Hydroxy: 39 ng/mL (ref 30–89)

## 2011-07-06 ENCOUNTER — Ambulatory Visit (INDEPENDENT_AMBULATORY_CARE_PROVIDER_SITE_OTHER)
Admission: RE | Admit: 2011-07-06 | Discharge: 2011-07-06 | Disposition: A | Discharge status: Home / Self Care | Payer: BC Managed Care – PPO | Source: Ambulatory Visit | Attending: Family Medicine | Admitting: Family Medicine

## 2011-07-06 ENCOUNTER — Encounter: Payer: Self-pay | Admitting: Family Medicine

## 2011-07-06 ENCOUNTER — Ambulatory Visit (INDEPENDENT_AMBULATORY_CARE_PROVIDER_SITE_OTHER): Payer: BC Managed Care – PPO | Admitting: Family Medicine

## 2011-07-06 VITALS — BP 100/60 | HR 73 | Temp 98.5°F | Ht 66.0 in | Wt 175.8 lb

## 2011-07-06 DIAGNOSIS — M25561 Pain in right knee: Secondary | ICD-10-CM

## 2011-07-06 DIAGNOSIS — Z0000 Encounter for general adult medical examination without abnormal findings: Secondary | ICD-10-CM

## 2011-07-06 DIAGNOSIS — M25569 Pain in unspecified knee: Secondary | ICD-10-CM

## 2011-07-06 NOTE — Progress Notes (Signed)
Patient Name: Roger Ferguson Date of Birth: May 04, 1966 Age: 45 y.o. Medical Record Number: [id number] Gender: male Date of Encounter: 07/06/2011  History of Present Illness:  Roger Ferguson is a 45 y.o. very pleasant male patient who presents with the following:  No cancer history. Stress at work.  Decreased exercise recently  Preventative Health Maintenance Visit:  Health Maintenance Summary Reviewed and updated, unless pt declines services.  Tobacco History Reviewed. Alcohol: No concerns, no excessive use Exercise Habits: decreased activity STD concerns: no risk or activity to increase risk Drug Use: None Encouraged self-testicular check  Health Maintenance  Topic Date Due  . Influenza Vaccine  04/11/2011  . Tetanus/tdap  06/07/2020  had flu shot at work  Labs reviewed with the patient.   Lipids:    Component Value Date/Time   CHOL 164 06/29/2011 0904   TRIG 124.0 06/29/2011 0904   HDL 39.30 06/29/2011 0904   VLDL 24.8 06/29/2011 0904   CHOLHDL 4 06/29/2011 0904    CBC:    Component Value Date/Time   WBC 7.3 06/29/2011 0904   HGB 16.3 06/29/2011 0904   HCT 47.7 06/29/2011 0904   PLT 244.0 06/29/2011 0904   MCV 90.3 06/29/2011 0904   NEUTROABS 4.4 06/29/2011 0904   LYMPHSABS 2.1 06/29/2011 0904   MONOABS 0.4 06/29/2011 0904   EOSABS 0.2 06/29/2011 0904   BASOSABS 0.0 06/29/2011 0904    Basic Metabolic Panel:    Component Value Date/Time   NA 143 06/29/2011 0904   K 4.6 06/29/2011 0904   CL 106 06/29/2011 0904   CO2 31 06/29/2011 0904   BUN 13 06/29/2011 0904   CREATININE 1.1 06/29/2011 0904   GLUCOSE 109* 06/29/2011 0904   CALCIUM 9.8 06/29/2011 0904    Lab Results  Component Value Date   ALT 8 06/29/2011   AST 14 06/29/2011   ALKPHOS 54 06/29/2011   BILITOT 0.5 06/29/2011    Lab Results  Component Value Date   PSA 0.83 06/29/2011   PSA 0.74 03/30/2010   PSA 0.80 03/24/2009   Patient presents with many months h/o R sided knee pain after  no specific injury, but has a waxing and waning nature has gotten worse since he has gained some weight. No audible pop was heard. The patient has had a R effusion. No symptomatic giving-way. No mechanical clicking. Joint has not locked up. Patient has been able to walk. The patient does have pain going up and down stairs or rising from a seated position.   Pain location: anterior, R > L Current physical activity: rare working out Prior Knee Surgery: none Current pain meds: Bracing: none Occupation or school levelPersonal assistant   Past Medical History, Surgical History, Social History, Family History, Problem List, Medications, and Allergies have been reviewed and updated if relevant.  Review of Systems:  General: Denies fever, chills, sweats. No significant weight loss. Eyes: Denies blurring,significant itching ENT: Denies earache, sore throat, and hoarseness. Cardiovascular: Denies chest pains, palpitations, dyspnea on exertion Respiratory: Denies cough, dyspnea at rest,wheeezing Breast: no concerns about lumps GI: Denies nausea, vomiting, diarrhea, constipation, change in bowel habits, abdominal pain, melena, hematochezia GU: Denies penile discharge, ED, urinary flow / outflow problems. No STD concerns. Musculoskeletal: above Derm: Denies rash, itching Neuro: Denies  paresthesias, frequent falls, frequent headaches Psych: Denies depression, anxiety Endocrine: Denies cold intolerance, heat intolerance, polydipsia Heme: Denies enlarged lymph nodes Allergy: No hayfever   Physical Examination: Filed Vitals:   07/06/11 1406  BP: 100/60  Pulse: 73  Temp: 98.5 F (36.9 C)  TempSrc: Oral  Height: 5\' 6"  (1.676 m)  Weight: 175 lb 12.8 oz (79.742 kg)  SpO2: 100%    Body mass index is 28.37 kg/(m^2).   Wt Readings from Last 3 Encounters:  07/06/11 175 lb 12.8 oz (79.742 kg)  02/10/11 173 lb 6.4 oz (78.654 kg)  12/30/10 179 lb 1.9 oz (81.248 kg)    GEN: well developed, well  nourished, no acute distress Eyes: conjunctiva and lids normal, PERRLA, EOMI ENT: TM clear, nares clear, oral exam WNL Neck: supple, no lymphadenopathy, no thyromegaly, no JVD Pulm: clear to auscultation and percussion, respiratory effort normal CV: regular rate and rhythm, S1-S2, no murmur, rub or gallop, no bruits, peripheral pulses normal and symmetric, no cyanosis, clubbing, edema or varicosities Chest: no scars, masses GI: soft, non-tender; no hepatosplenomegaly, masses; active bowel sounds all quadrants GU: no hernia, testicular mass, penile discharge, or prostate enlargement Lymph: no cervical, axillary or inguinal adenopathy MSK:   Knee:  B Gait: Normal heel toe pattern ROM: 0-130 Effusion: mild on R Echymosis or edema: none Patellar tendon NT Painful PLICA: neg Patellar grind: mild pos Medial and lateral patellar facet loading: R > L ttp medial and lateral joint lines:NT Mcmurray's neg Flexion-pinch neg Varus and valgus stress: stable Lachman: neg Ant and Post drawer: neg Hip abduction, IR, ER: WNL Hip flexion str: 5/5 Hip abd: 5/5 Quad: 5/5 VMO atrophy: mild to moderate B Hamstring concentric and eccentric: 5/5   SKIN: clear, good turgor, color WNL, no rashes, lesions, or ulcerations Neuro: normal mental status, normal strength, sensation, and motion Psych: alert; oriented to person, place and time, normally interactive and not anxious or depressed in appearance.   Assessment and Plan: 1. Routine general medical examination at a health care facility    2. Right knee pain  DG Knee 1-2 Views Right, DG Knee Bilateral Standing AP    The patient's preventative maintenance and recommended screening tests for an annual wellness exam were reviewed in full today. Brought up to date unless services declined.  Counselled on the importance of diet, exercise, and its role in overall health and mortality. The patient's FH and SH was reviewed, including their home life,  tobacco status, and drug and alcohol status.   Overall doing well -- needs to lose weight  2. I think knee all PFS --- needs to get back into gym, lose extra abdominal weight, work on quads, hips

## 2011-09-15 ENCOUNTER — Encounter: Payer: Self-pay | Admitting: Family Medicine

## 2011-09-15 ENCOUNTER — Ambulatory Visit (INDEPENDENT_AMBULATORY_CARE_PROVIDER_SITE_OTHER): Payer: BC Managed Care – PPO | Admitting: Family Medicine

## 2011-09-15 VITALS — BP 120/78 | HR 75 | Temp 97.8°F | Ht 66.0 in | Wt 169.8 lb

## 2011-09-15 DIAGNOSIS — M25539 Pain in unspecified wrist: Secondary | ICD-10-CM

## 2011-09-15 NOTE — Progress Notes (Signed)
  Patient Name: Roger Ferguson Date of Birth: July 02, 1966 Medical Record Number: [id number] Gender: male Date of Encounter: 09/15/2011  History of Present Illness:  Roger Ferguson is a 46 y.o. very pleasant male patient who presents with the following:  Larey Seat with hand in extension -- has been bothering him for at least six weeks and put his thumb spica splint back on The patient fell on his outstretched hand in November 2011. That time he did have an initial x-ray series that was negative. I saw him in followup, and ultimately we placed him in a thumb spica splint, negative intra-articular wrist injection. He improved and had relief of symptoms, but now they have returned and are notably worsened with twisting maneuvers with his wrist.  Depressed some. Fr some time. More anxious. Some decreased interest. Some helpless. No si or hi  Patient Active Problem List  Diagnoses  . UNSPECIFIED VITAMIN B DEFICIENCY  . UNSPECIFIED VITAMIN D DEFICIENCY  . ANAL FISSURE  . OTHER SPECIFIED DISORDER OF RECTUM AND ANUS  . BACK PAIN  . FATIGUE  . Patellofemoral syndrome   Past Medical History  Diagnosis Date  . Anal itching     chronic  . History of hematuria     cystoscopy neg, rads eval   Past Surgical History  Procedure Date  . Rectal botox injection 09-2008   History  Substance Use Topics  . Smoking status: Current Some Day Smoker  . Smokeless tobacco: Not on file  . Alcohol Use: Yes   No family history on file. No Known Allergies  Medication list has been reviewed and updated.  Review of Systems:  GEN: No fevers, chills. Nontoxic. Primarily MSK c/o today. MSK: Detailed in the HPI GI: tolerating PO intake without difficulty Neuro: No numbness, parasthesias, or tingling associated. Otherwise the pertinent positives of the ROS are noted above.    Physical Examination: Filed Vitals:   09/15/11 0857  BP: 120/78  Pulse: 75  Temp: 97.8 F (36.6 C)  TempSrc: Oral  Height: 5\' 6"  (1.676  m)  Weight: 169 lb 12.8 oz (77.021 kg)  SpO2: 100%    Body mass index is 27.41 kg/(m^2).   GEN: WDWN, NAD, Non-toxic, Alert & Oriented x 3 HEENT: Atraumatic, Normocephalic.  Ears and Nose: No external deformity. EXTR: No clubbing/cyanosis/edema NEURO: Normal gait.  PSYCH: Normally interactive. Conversant. Not depressed or anxious appearing.  Calm demeanor.   Hand: L Ecchymosis or edema: neg ROM wrist/hand/digits/elbow: full  Carpals, MCP's, digits: NT Distal Ulna and Radius: NT Supination lift test: neg Ecchymosis or edema: neg Cysts/nodules: neg Finkelstein's test: neg Snuffbox tenderness: neg Scaphoid tubercle: NT Hook of Hamate: NT Resisted supination: painful Full composite fist Grip, all digits: 5/5 str No tenosynovitis Axial load test: pos Phalen's: neg Tinel's: neg Atrophy: neg  Hand sensation: intact   Assessment and Plan: 1. Wrist pain  Ambulatory referral to Orthopedic Surgery    Also some depression. Counseled regard regarding this. He is going to increase his activity and get out in the sunlight. Also discussed the possibility of going on low-dose antidepressant, but right now he wants to do some non-pharmacotherapy.  Less I wrist pain, concern for internal derangement, potential carpal ligament disruption from prior injury. We discussed the possibility of an MR arthrogram, but for now, he would rather have one of the hand surgeons evaluate him first, which I think is certainly very reasonable.

## 2011-09-15 NOTE — Patient Instructions (Signed)
REFERRAL: GO THE THE FRONT ROOM AT THE ENTRANCE OF OUR CLINIC, NEAR CHECK IN. ASK FOR MARION. SHE WILL HELP YOU SET UP YOUR REFERRAL. DATE: TIME:  

## 2011-09-23 ENCOUNTER — Other Ambulatory Visit: Payer: Self-pay | Admitting: Orthopedic Surgery

## 2011-09-23 DIAGNOSIS — M25532 Pain in left wrist: Secondary | ICD-10-CM

## 2011-10-03 ENCOUNTER — Telehealth: Payer: Self-pay

## 2011-10-03 ENCOUNTER — Ambulatory Visit
Admission: RE | Admit: 2011-10-03 | Discharge: 2011-10-03 | Disposition: A | Discharge status: Home / Self Care | Payer: BC Managed Care – PPO | Source: Ambulatory Visit | Attending: Orthopedic Surgery | Admitting: Orthopedic Surgery

## 2011-10-03 DIAGNOSIS — M25532 Pain in left wrist: Secondary | ICD-10-CM

## 2011-10-03 MED ORDER — IOHEXOL 180 MG/ML  SOLN
1.5000 mL | Freq: Once | INTRAMUSCULAR | Status: AC | PRN
  Administered 2011-10-03: 1.5 mL via INTRA_ARTICULAR

## 2011-10-03 NOTE — Telephone Encounter (Signed)
I spoke to the patient regarding his MR arthrogram as he requested. 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 He is currently seeing Dr. Mina Marble and has followup on Thursday. We discussed scapholunate dissociation, and I recommended that he followup with his surgeon and consider operative management.

## 2011-10-03 NOTE — Telephone Encounter (Signed)
Pt said he had recently seen Dr Patsy Lager and Dr Patsy Lager suggested pt see orthopedic. Pt said he saw orthopedic and also had MRI done today. Pt wants call back from Dr Patsy Lager or his nurse with his advise on how to proceed because he values Dr Brayton Layman opinion. Pt understands he will not get call back today.

## 2011-10-05 ENCOUNTER — Other Ambulatory Visit: Payer: BC Managed Care – PPO

## 2011-10-13 ENCOUNTER — Telehealth: Payer: Self-pay | Admitting: *Deleted

## 2011-10-13 DIAGNOSIS — M25339 Other instability, unspecified wrist: Secondary | ICD-10-CM

## 2011-10-13 NOTE — Telephone Encounter (Signed)
I think he might be better served talking about particulars about hand surgical procedures to a hand surgeon, since I don't operate.  If he has questions or concerns about Dr. Ronie Spies recommendations, then he should have a second opinion from a different hand surgeon. That is always reasonable with surgery.  I would be happy to refer him to the hand surgeons at [facility], [place], or [place].

## 2011-10-13 NOTE — Telephone Encounter (Signed)
Patient is requesting a call from Dr. Patsy Lager.  He stated that he saw an ortho doctor and was given a treatment/procedure option but wants Dr. Cyndie Chime opinion.  He would not go into any further detail.

## 2011-10-14 NOTE — Telephone Encounter (Signed)
Patient says he would  Like a second opinion  And would like to go to [facility].

## 2011-10-16 ENCOUNTER — Encounter: Payer: Self-pay | Admitting: *Deleted

## 2011-10-16 DIAGNOSIS — M25339 Other instability, unspecified wrist: Secondary | ICD-10-CM

## 2011-10-16 HISTORY — DX: Other instability, unspecified wrist: M25.339

## 2011-10-16 NOTE — Telephone Encounter (Signed)
I will consult Dr. Amanda Pea or Melvyn Novas for their expertise.

## 2012-06-28 ENCOUNTER — Other Ambulatory Visit: Payer: Self-pay | Admitting: Family Medicine

## 2012-06-28 DIAGNOSIS — R5383 Other fatigue: Secondary | ICD-10-CM

## 2012-06-28 DIAGNOSIS — Z13220 Encounter for screening for lipoid disorders: Secondary | ICD-10-CM

## 2012-06-28 DIAGNOSIS — Z125 Encounter for screening for malignant neoplasm of prostate: Secondary | ICD-10-CM

## 2012-06-28 DIAGNOSIS — R5381 Other malaise: Secondary | ICD-10-CM

## 2012-06-28 DIAGNOSIS — E559 Vitamin D deficiency, unspecified: Secondary | ICD-10-CM

## 2012-06-28 DIAGNOSIS — E539 Vitamin B deficiency, unspecified: Secondary | ICD-10-CM

## 2012-06-29 ENCOUNTER — Other Ambulatory Visit: Payer: BC Managed Care – PPO

## 2012-07-03 ENCOUNTER — Other Ambulatory Visit (INDEPENDENT_AMBULATORY_CARE_PROVIDER_SITE_OTHER): Payer: BC Managed Care – PPO

## 2012-07-03 DIAGNOSIS — R5381 Other malaise: Secondary | ICD-10-CM

## 2012-07-03 DIAGNOSIS — E559 Vitamin D deficiency, unspecified: Secondary | ICD-10-CM

## 2012-07-03 DIAGNOSIS — Z125 Encounter for screening for malignant neoplasm of prostate: Secondary | ICD-10-CM

## 2012-07-03 DIAGNOSIS — E539 Vitamin B deficiency, unspecified: Secondary | ICD-10-CM

## 2012-07-03 DIAGNOSIS — Z13220 Encounter for screening for lipoid disorders: Secondary | ICD-10-CM

## 2012-07-03 LAB — CBC WITH DIFFERENTIAL/PLATELET
Basophils Absolute: 0 10*3/uL (ref 0.0–0.1)
Eosinophils Absolute: 0.3 10*3/uL (ref 0.0–0.7)
HCT: 46.3 % (ref 39.0–52.0)
Hemoglobin: 15.3 g/dL (ref 13.0–17.0)
Lymphs Abs: 2.5 10*3/uL (ref 0.7–4.0)
MCHC: 33.1 g/dL (ref 30.0–36.0)
Monocytes Absolute: 0.6 10*3/uL (ref 0.1–1.0)
Neutro Abs: 4 10*3/uL (ref 1.4–7.7)
Platelets: 242 10*3/uL (ref 150.0–400.0)
RDW: 13.1 % (ref 11.5–14.6)

## 2012-07-03 LAB — BASIC METABOLIC PANEL
Calcium: 9.5 mg/dL (ref 8.4–10.5)
Creatinine, Ser: 1.1 mg/dL (ref 0.4–1.5)
GFR: 73.98 mL/min (ref >60.00)
Sodium: 136 mEq/L (ref 135–145)

## 2012-07-03 LAB — VITAMIN B12: Vitamin B-12: 204 pg/mL — ABNORMAL LOW (ref 211–911)

## 2012-07-03 LAB — HEPATIC FUNCTION PANEL
AST: 17 U/L (ref 0–37)
Albumin: 4.1 g/dL (ref 3.5–5.2)
Alkaline Phosphatase: 51 U/L (ref 39–117)
Bilirubin, Direct: 0.2 mg/dL (ref 0.0–0.3)

## 2012-07-03 LAB — LIPID PANEL
Cholesterol: 150 mg/dL (ref 0–200)
LDL Cholesterol: 87 mg/dL (ref 0–99)
Total CHOL/HDL Ratio: 5
Triglycerides: 151 mg/dL — ABNORMAL HIGH (ref 0.0–149.0)

## 2012-07-03 LAB — PSA: PSA: 0.87 ng/mL (ref 0.10–4.00)

## 2012-07-04 LAB — VITAMIN D 25 HYDROXY (VIT D DEFICIENCY, FRACTURES): Vit D, 25-Hydroxy: 45 ng/mL (ref 30–89)

## 2012-07-05 ENCOUNTER — Other Ambulatory Visit: Payer: BC Managed Care – PPO

## 2012-07-09 ENCOUNTER — Encounter: Payer: Self-pay | Admitting: Family Medicine

## 2012-07-09 ENCOUNTER — Ambulatory Visit (INDEPENDENT_AMBULATORY_CARE_PROVIDER_SITE_OTHER): Payer: BC Managed Care – PPO | Admitting: Family Medicine

## 2012-07-09 VITALS — BP 110/74 | HR 74 | Temp 98.3°F | Ht 65.0 in | Wt 177.8 lb

## 2012-07-09 DIAGNOSIS — Z0000 Encounter for general adult medical examination without abnormal findings: Secondary | ICD-10-CM

## 2012-07-09 MED ORDER — MELOXICAM 15 MG PO TABS: 15.0000 mg | ORAL_TABLET | Freq: Every day | ORAL | Status: DC

## 2012-07-09 NOTE — Patient Instructions (Addendum)
Plantar Fascitis.  STRETCHING and Strengthening program critically important.  Strengthening on foot and calf muscles Calf raises, 2 legged, then 1 legged. Foot massage with tennis ball. Ice massage.  Towel Scrunches: get a towel or hand towel, use toes to pick up and scrunch up the towel.  NEEDS TO BE DONE EVERY DAY  Recommended over the counter insoles. (Spenco or Hapad)  A rigid shoe with good arch support helps: Dansko (great), Randel Pigg, Merrell No easily bendable shoes.   Tuli's heel cups

## 2012-07-09 NOTE — Progress Notes (Signed)
[facility] at [facility] [address] [place] [address] Phone: [phone] Fax: [phone]  Date:  07/09/2012   Name:  Roger Ferguson   DOB:  19-Feb-1966   MRN:  [id number] Gender: male Age: 46 y.o.  PCP:  Hannah Beat, MD  Evaluating MD: Hannah Beat, MD   Chief Complaint: Annual Exam   History of Present Illness:  Roger Ferguson is a 46 y.o. pleasant patient who presents with the following:  Preventative Health Maintenance Visit:  Health Maintenance Summary Reviewed and updated, unless pt declines services.  Tobacco History Reviewed. Alcohol: No concerns, no excessive use Exercise Habits: minimal activity STD concerns: no risk or activity to increase risk Drug Use: None Encouraged self-testicular check  Health Maintenance  Topic Date Due  . Influenza Vaccine  03/11/2013  . Tetanus/tdap  06/07/2020    Labs reviewed with the patient.  Results for orders placed in visit on 07/03/12  LIPID PANEL      Component Value Range   Cholesterol 150  0 - 200 mg/dL   Triglycerides 295.2 (*) 0.0 - 149.0 mg/dL   HDL 84.13 (*) >24.40 mg/dL   VLDL 10.2  0.0 - 72.5 mg/dL   LDL Cholesterol 87  0 - 99 mg/dL   Total CHOL/HDL Ratio 5    HEPATIC FUNCTION PANEL      Component Value Range   Total Bilirubin 1.0  0.3 - 1.2 mg/dL   Bilirubin, Direct 0.2  0.0 - 0.3 mg/dL   Alkaline Phosphatase 51  39 - 117 U/L   AST 17  0 - 37 U/L   ALT 13  0 - 53 U/L   Total Protein 6.5  6.0 - 8.3 g/dL   Albumin 4.1  3.5 - 5.2 g/dL  CBC WITH DIFFERENTIAL      Component Value Range   WBC 7.4  4.5 - 10.5 K/uL   RBC 5.24  4.22 - 5.81 Mil/uL   Hemoglobin 15.3  13.0 - 17.0 g/dL   HCT 36.6  44.0 - 34.7 %   MCV 88.3  78.0 - 100.0 fl   MCHC 33.1  30.0 - 36.0 g/dL   RDW 42.5  95.6 - 38.7 %   Platelets 242.0  150.0 - 400.0 K/uL   Neutrophils Relative 54.2  43.0 - 77.0 %   Lymphocytes Relative 34.0  12.0 - 46.0 %   Monocytes Relative 7.6  3.0 - 12.0 %   Eosinophils Relative 3.7   0.0 - 5.0 %   Basophils Relative 0.5  0.0 - 3.0 %   Neutro Abs 4.0  1.4 - 7.7 K/uL   Lymphs Abs 2.5  0.7 - 4.0 K/uL   Monocytes Absolute 0.6  0.1 - 1.0 K/uL   Eosinophils Absolute 0.3  0.0 - 0.7 K/uL   Basophils Absolute 0.0  0.0 - 0.1 K/uL  BASIC METABOLIC PANEL      Component Value Range   Sodium 136  135 - 145 mEq/L   Potassium 3.9  3.5 - 5.1 mEq/L   Chloride 100  96 - 112 mEq/L   CO2 29  19 - 32 mEq/L   Glucose, Bld 103 (*) 70 - 99 mg/dL   BUN 14  6 - 23 mg/dL   Creatinine, Ser 1.1  0.4 - 1.5 mg/dL   Calcium 9.5  8.4 - 56.4 mg/dL   GFR 33.29  >51.88 mL/min  PSA      Component Value Range   PSA 0.87  0.10 - 4.00 ng/mL  VITAMIN D 25 HYDROXY      Component Value Range   Vit D, 25-Hydroxy 45  30 - 89 ng/mL  VITAMIN B12      Component Value Range   Vitamin B-12 204 (*) 211 - 911 pg/mL     Still working all the time Some times will get reflux, friend gave some dexilant,  Helped  Plantar fascitis: Knee pain:    Classic plantar fasciitis on the LEFT side as well as having some intermittent knee pain, mostly on the LEFT with the intermittent effusions on the RIGHT.  Patient Active Problem List  Diagnosis  . UNSPECIFIED VITAMIN B DEFICIENCY  . UNSPECIFIED VITAMIN D DEFICIENCY  . ANAL FISSURE  . OTHER SPECIFIED DISORDER OF RECTUM AND ANUS  . BACK PAIN  . FATIGUE  . Patellofemoral syndrome  . Scapholunate Dissociation    Past Medical History  Diagnosis Date  . Anal itching     chronic  . History of hematuria     cystoscopy neg, rads eval  . Scapholunate dissociation 10/16/2011    Past Surgical History  Procedure Date  . Rectal botox injection 09-2008    History  Substance Use Topics  . Smoking status: Current Some Day Smoker  . Smokeless tobacco: Not on file  . Alcohol Use: Yes    No family history on file.  No Known Allergies  Medication list has been reviewed and updated.  No outpatient prescriptions prior to visit.    Last reviewed on 07/09/2012   2:47 PM by Yetta Glassman, LPN  Review of Systems:   General: Denies fever, chills, sweats. No significant weight loss. Eyes: Denies blurring,significant itching ENT: Denies earache, sore throat, and hoarseness. Cardiovascular: Denies chest pains, palpitations, dyspnea on exertion Respiratory: Denies cough, dyspnea at rest,wheeezing Breast: no concerns about lumps GI: Denies nausea, vomiting, diarrhea, constipation, change in bowel habits, abdominal pain, melena, hematochezia - intermittent GERD GU: Denies penile discharge, ED, urinary flow / outflow problems. No STD concerns. Musculoskeletal: some right sided knee pain with effusion Derm: Denies rash, itching Neuro: Denies  paresthesias, frequent falls, frequent headaches Psych: Denies depression, anxiety Endocrine: Denies cold intolerance, heat intolerance, polydipsia Heme: Denies enlarged lymph nodes Allergy: No hayfever   Physical Examination: BP 110/74  Pulse 74  Temp 98.3 F (36.8 C) (Oral)  Ht 5\' 5"  (1.651 m)  Wt 177 lb 12 oz (80.627 kg)  BMI 29.58 kg/m2  SpO2 98%  Ideal Body Weight: Weight in (lb) to have BMI = 25: 149.9    Wt Readings from Last 3 Encounters:  07/09/12 177 lb 12 oz (80.627 kg)  09/15/11 169 lb 12.8 oz (77.021 kg)  07/06/11 175 lb 12.8 oz (79.742 kg)    GEN: well developed, well nourished, no acute distress Eyes: conjunctiva and lids normal, PERRLA, EOMI ENT: TM clear, nares clear, oral exam WNL Neck: supple, no lymphadenopathy, no thyromegaly, no JVD Pulm: clear to auscultation and percussion, respiratory effort normal CV: regular rate and rhythm, S1-S2, no murmur, rub or gallop, no bruits, peripheral pulses normal and symmetric, no cyanosis, clubbing, edema or varicosities Chest: no scars, masses GI: soft, non-tender; no hepatosplenomegaly, masses; active bowel sounds all quadrants GU: no hernia, testicular mass, penile discharge, or prostate enlargement Lymph: no cervical, axillary or  inguinal adenopathy MSK: gait normal, muscle tone and strength WNL. R knee effusion, neg lachman, ACL, PCL, mcl, lcl all intact. Neg mcmurrays and bounce home testing. SKIN: clear, good turgor, color WNL, no rashes, lesions, or ulcerations  Neuro: normal mental status, normal strength, sensation, and motion Psych: alert; oriented to person, place and time, normally interactive and not anxious or depressed in appearance.   Assessment and Plan:  1. Routine general medical examination at a health care facility    The patient's preventative maintenance and recommended screening tests for an annual wellness exam were reviewed in full today. Brought up to date unless services declined.  Counselled on the importance of diet, exercise, and its role in overall health and mortality. The patient's FH and SH was reviewed, including their home life, tobacco status, and drug and alcohol status.  A lot of bone and joint complaints today. Reviewed classic plantar fasciitis rehabilitation, med she recommendations, and recommended Mobic daily for the next several weeks for his knee and using a new compression sleeve.  If he continues to have some problems, he can come in and I will aspirate his knee and inject with steroids.   Orders Today:  No orders of the defined types were placed in this encounter.    Updated Medication List: (Includes new medications, updates to list, dose adjustments) Meds ordered this encounter  Medications  . Multiple Vitamin (MULTIVITAMIN) tablet    Sig: Take 1 tablet by mouth daily.  . Cholecalciferol (VITAMIN D PO)    Sig: Take by mouth. As directed  . Glucosamine-Chondroitin (GLUCOSAMINE CHONDR COMPLEX PO)    Sig: Take 1 tablet by mouth daily.  . meloxicam (MOBIC) 15 MG tablet    Sig: Take 1 tablet (15 mg total) by mouth daily.    Dispense:  30 tablet    Refill:  2    Medications Discontinued: There are no discontinued medications.   Hannah Beat, MD

## 2013-02-13 ENCOUNTER — Encounter: Payer: Self-pay | Admitting: Family Medicine

## 2013-02-13 ENCOUNTER — Ambulatory Visit (INDEPENDENT_AMBULATORY_CARE_PROVIDER_SITE_OTHER): Payer: BC Managed Care – PPO | Admitting: Family Medicine

## 2013-02-13 VITALS — BP 110/70 | HR 69 | Temp 98.0°F | Wt 181.0 lb

## 2013-02-13 DIAGNOSIS — K219 Gastro-esophageal reflux disease without esophagitis: Secondary | ICD-10-CM

## 2013-02-13 NOTE — Patient Instructions (Addendum)
Start protonix (pantoprazole) 40 mg daily... For 4-6 weeks, then try tapering off.  If no improvement in symptoms in 2 weeks call for GI referral for endoscopy.  Also, decrease food that trigger reflux... Alcohol, caffeine, chocolate, spicy, acidic foods like tomato and citris fruit, peppermint.

## 2013-02-13 NOTE — Assessment & Plan Note (Signed)
Foreign body sensation likely from swelling due to ccontinued reflux.  Agree with starting protonix 40 mg daily. Trigger avoidance. If not imprpving in 2 weeks... Refer to GI.

## 2013-02-13 NOTE — Progress Notes (Signed)
  Subjective:    Patient ID: Roger Ferguson, male    DOB: Jun 17, 1966, 47 y.o.   MRN: [id number]  HPI  47 year old male pt of Dr. Cyndie Chime  With history of heartburn presents with continued reflux. He has had some improvement in last 3 months on prilosec 20 mg daily.  Recently has been eating out...in last 5 days he had flare of reflux, sour taste in throat and burning in throat. His friend is a GI MD. Improved with protonix of friends. He still  feels food though hanging in his throat in last week.      Review of Systems  Constitutional: Negative for fever and fatigue.  HENT: Negative for congestion.   Eyes: Negative for pain.  Respiratory: Negative for shortness of breath.   Cardiovascular: Negative for chest pain, palpitations and leg swelling.  Gastrointestinal: Negative for abdominal pain, diarrhea, constipation and abdominal distention.       Objective:   Physical Exam  Constitutional: Vital signs are normal. He appears well-developed and well-nourished.  HENT:  Head: Normocephalic.  Right Ear: Hearing normal.  Left Ear: Hearing normal.  Nose: Nose normal.  Mouth/Throat: Oropharynx is clear and moist and mucous membranes are normal.  Neck: Trachea normal. Carotid bruit is not present. No mass and no thyromegaly present.  Cardiovascular: Normal rate, regular rhythm and normal pulses.  Exam reveals no gallop, no distant heart sounds and no friction rub.   No murmur heard. No peripheral edema  Pulmonary/Chest: Effort normal and breath sounds normal. No respiratory distress.  Skin: Skin is warm, dry and intact. No rash noted.  Psychiatric: He has a normal mood and affect. His speech is normal and behavior is normal. Thought content normal.          Assessment & Plan:

## 2013-05-02 ENCOUNTER — Other Ambulatory Visit: Payer: Self-pay

## 2013-05-02 NOTE — Telephone Encounter (Signed)
Pt left note requesting refill protonix to [facility] [address] Pt was seen on 02/13/13 and advised to take protonix 40 mg daily for 4-6 weeks and then try to taper off. If symptoms did not improve in 2 weeks pt was to cb for GI referral. Pt said he has not tried to taper off protonix; pt has been taking protonix 40 mg daily with relief of symptoms and pt prefers not to see GI doctor. Please advise. Pt request cb.

## 2013-05-03 NOTE — Telephone Encounter (Signed)
Pt called back and is running out of med and request refill and cb today.

## 2013-05-04 MED ORDER — PANTOPRAZOLE SODIUM 40 MG PO TBEC: 40.0000 mg | DELAYED_RELEASE_TABLET | Freq: Every day | ORAL | Status: DC

## 2013-05-04 NOTE — Telephone Encounter (Signed)
Okay ot stay on medication.. Refilled.

## 2013-05-06 NOTE — Telephone Encounter (Signed)
Left message on voicemail that prescription has been sent to [facility] [address]

## 2013-07-01 ENCOUNTER — Telehealth: Payer: Self-pay | Admitting: Family Medicine

## 2013-07-01 NOTE — Telephone Encounter (Signed)
i could see on 24th or 26th, 30 mins

## 2013-07-01 NOTE — Telephone Encounter (Signed)
Patient scheduled his physical on 07/03/13 at 9:00.  He scheduled his lab work on 07/02/13.

## 2013-07-01 NOTE — Telephone Encounter (Signed)
Patient called to schedule his physical.  Patient said he has to have the physical done before the end of the year or he loses his benefits for 2014. Can patient be seen this week?

## 2013-07-02 ENCOUNTER — Other Ambulatory Visit (INDEPENDENT_AMBULATORY_CARE_PROVIDER_SITE_OTHER): Payer: BC Managed Care – PPO

## 2013-07-02 DIAGNOSIS — Z125 Encounter for screening for malignant neoplasm of prostate: Secondary | ICD-10-CM

## 2013-07-02 DIAGNOSIS — E539 Vitamin B deficiency, unspecified: Secondary | ICD-10-CM

## 2013-07-02 DIAGNOSIS — E559 Vitamin D deficiency, unspecified: Secondary | ICD-10-CM

## 2013-07-02 DIAGNOSIS — Z79899 Other long term (current) drug therapy: Secondary | ICD-10-CM

## 2013-07-02 DIAGNOSIS — Z13220 Encounter for screening for lipoid disorders: Secondary | ICD-10-CM

## 2013-07-02 LAB — CBC WITH DIFFERENTIAL/PLATELET
Basophils Relative: 1 % (ref 0–1)
Eosinophils Relative: 6 % — ABNORMAL HIGH (ref 0–5)
HCT: 46.2 % (ref 39.0–52.0)
Hemoglobin: 15.8 g/dL (ref 13.0–17.0)
MCHC: 34.2 g/dL (ref 30.0–36.0)
MCV: 87.2 fL (ref 78.0–100.0)
Monocytes Absolute: 0.5 10*3/uL (ref 0.1–1.0)
Monocytes Relative: 7 % (ref 3–12)
Neutro Abs: 3.3 10*3/uL (ref 1.7–7.7)

## 2013-07-02 LAB — LIPID PANEL
HDL: 38 mg/dL — ABNORMAL LOW (ref >39)
LDL Cholesterol: 85 mg/dL (ref 0–99)

## 2013-07-02 LAB — HEPATIC FUNCTION PANEL
Albumin: 4.3 g/dL (ref 3.5–5.2)
Alkaline Phosphatase: 52 U/L (ref 39–117)
Bilirubin, Direct: 0.2 mg/dL (ref 0.0–0.3)
Indirect Bilirubin: 0.7 mg/dL (ref 0.0–0.9)
Total Bilirubin: 0.9 mg/dL (ref 0.3–1.2)

## 2013-07-02 LAB — BASIC METABOLIC PANEL
BUN: 11 mg/dL (ref 6–23)
Chloride: 103 mEq/L (ref 96–112)
Glucose, Bld: 107 mg/dL — ABNORMAL HIGH (ref 70–99)
Potassium: 4.4 mEq/L (ref 3.5–5.3)

## 2013-07-03 ENCOUNTER — Encounter: Payer: Self-pay | Admitting: Family Medicine

## 2013-07-03 ENCOUNTER — Ambulatory Visit (INDEPENDENT_AMBULATORY_CARE_PROVIDER_SITE_OTHER): Payer: BC Managed Care – PPO | Admitting: Family Medicine

## 2013-07-03 VITALS — BP 132/64 | HR 76 | Temp 98.6°F | Ht 64.5 in | Wt 180.5 lb

## 2013-07-03 DIAGNOSIS — Z0000 Encounter for general adult medical examination without abnormal findings: Secondary | ICD-10-CM

## 2013-07-03 LAB — PSA: PSA: 1.07 ng/mL (ref <=4.00)

## 2013-07-03 MED ORDER — PANTOPRAZOLE SODIUM 40 MG PO TBEC: 40.0000 mg | DELAYED_RELEASE_TABLET | Freq: Every day | ORAL | Status: DC

## 2013-07-03 NOTE — Progress Notes (Signed)
Pre-visit discussion using our clinic review tool. No additional management support is needed unless otherwise documented below in the visit note.  

## 2013-07-03 NOTE — Progress Notes (Signed)
Date:  07/03/2013   Name:  Roger Ferguson   DOB:  07/03/1966   MRN:  [id number] Gender: male Age: 47 y.o.  Primary Physician:  Hannah Beat, MD   Chief Complaint: Annual Exam   Subjective:   History of Present Illness:  Roger Ferguson is a 47 y.o. pleasant patient who presents with the following:  Preventative Health Maintenance Visit:  Health Maintenance Summary Reviewed and updated, unless pt declines services.  Tobacco History Reviewed. Alcohol: No concerns, no excessive use Exercise Habits: Some activity, rec at least 30 mins 5 times a week STD concerns: no risk or activity to increase risk Drug Use: None Encouraged self-testicular check  Wt Readings from Last 3 Encounters:  07/03/13 180 lb 8 oz (81.874 kg)  02/13/13 181 lb (82.101 kg)  07/09/12 177 lb 12 oz (80.627 kg)   The patient is doing quite well, he does have some intermittent loose stools occasionally when he is more stressed. He is working about 60-75 hours a week. Occasional aches and pains.   Health Maintenance  Topic Date Due  . Influenza Vaccine  02/08/2013  . Tetanus/tdap  06/07/2020    Immunization History  Administered Date(s) Administered  . Influenza Split 03/25/2012  . Influenza Whole 04/24/2010  . Influenza-Unspecified 03/25/2013  . Td 06/07/2010    Patient Active Problem List   Diagnosis Date Noted  . GERD (gastroesophageal reflux disease) 02/13/2013  . Scapholunate Dissociation 10/16/2011  . Patellofemoral syndrome 12/30/2010  . UNSPECIFIED VITAMIN B DEFICIENCY 03/30/2010  . UNSPECIFIED VITAMIN D DEFICIENCY 03/30/2010  . BACK PAIN 10/26/2009  . FATIGUE 10/26/2009  . ANAL FISSURE 01/15/2009  . OTHER SPECIFIED DISORDER OF RECTUM AND ANUS 01/15/2009    Past Medical History  Diagnosis Date  . Anal itching     chronic  . History of hematuria     cystoscopy neg, rads eval  . Scapholunate dissociation 10/16/2011    Past Surgical History  Procedure Laterality Date  . Rectal  botox injection  09-2008    History   Social History  . Marital Status: Married    Spouse Name: N/A    Number of Children: N/A  . Years of Education: N/A   Occupational History  . Interior and spatial designer of operations    Social History Main Topics  . Smoking status: Current Some Day Smoker  . Smokeless tobacco: Not on file  . Alcohol Use: Yes  . Drug Use: No  . Sexual Activity: Not on file   Other Topics Concern  . Not on file   Social History Narrative   Regular exercise---no      Originally from [place]             No family history on file.  No Known Allergies  Medication list has been reviewed and updated.  Review of Systems:  General: Denies fever, chills, sweats. No significant weight loss. Eyes: Denies blurring,significant itching ENT: Denies earache, sore throat, and hoarseness. Cardiovascular: Denies chest pains, palpitations, dyspnea on exertion Respiratory: Denies cough, dyspnea at rest,wheeezing Breast: no concerns about lumps GI: Denies nausea, vomiting, diarrhea, constipation, change in bowel habits, abdominal pain, melena, hematochezia GU: Denies penile discharge, ED, urinary flow / outflow problems. No STD concerns. Musculoskeletal: Denies back pain, joint pain Derm: Denies rash, itching Neuro: Denies  paresthesias, frequent falls, frequent headaches Psych: Denies depression, anxiety Endocrine: Denies cold intolerance, heat intolerance, polydipsia Heme: Denies enlarged lymph nodes Allergy: No hayfever  Objective:   Physical Examination: BP 132/64  Pulse 76  Temp(Src) 98.6 F (37 C) (Oral)  Ht 5' 4.5" (1.638 m)  Wt 180 lb 8 oz (81.874 kg)  BMI 30.52 kg/m2  SpO2 98% Ideal Body Weight: Weight in (lb) to have BMI = 25: 147.6  GEN: well developed, well nourished, no acute distress Eyes: conjunctiva and lids normal, PERRLA, EOMI ENT: TM clear, nares clear, oral exam WNL Neck: supple, no lymphadenopathy, no thyromegaly, no JVD Pulm: clear to  auscultation and percussion, respiratory effort normal CV: regular rate and rhythm, S1-S2, no murmur, rub or gallop, no bruits, peripheral pulses normal and symmetric, no cyanosis, clubbing, edema or varicosities GI: soft, non-tender; no hepatosplenomegaly, masses; active bowel sounds all quadrants GU: no hernia, testicular mass, penile discharge Lymph: no cervical, axillary or inguinal adenopathy MSK: gait normal, muscle tone and strength WNL, no joint swelling, effusions, discoloration, crepitus  SKIN: clear, good turgor, color WNL, no rashes, lesions, or ulcerations Neuro: normal mental status, normal strength, sensation, and motion Psych: alert; oriented to person, place and time, normally interactive and not anxious or depressed in appearance.  All labs reviewed with patient.  Lipids:    Component Value Date/Time   CHOL 155 07/02/2013 1739   TRIG 160* 07/02/2013 1739   HDL 38* 07/02/2013 1739   VLDL 32 07/02/2013 1739   CHOLHDL 4.1 07/02/2013 1739    CBC:    Component Value Date/Time   WBC 6.3 07/02/2013 1739   HGB 15.8 07/02/2013 1739   HCT 46.2 07/02/2013 1739   PLT 256 07/02/2013 1739   MCV 87.2 07/02/2013 1739   NEUTROABS 3.3 07/02/2013 1739   LYMPHSABS 2.1 07/02/2013 1739   MONOABS 0.5 07/02/2013 1739   EOSABS 0.4 07/02/2013 1739   BASOSABS 0.0 07/02/2013 1739    Basic Metabolic Panel:    Component Value Date/Time   NA 140 07/02/2013 1739   K 4.4 07/02/2013 1739   CL 103 07/02/2013 1739   CO2 28 07/02/2013 1739   BUN 11 07/02/2013 1739   CREATININE 0.96 07/02/2013 1739   CREATININE 1.1 07/03/2012 0819   GLUCOSE 107* 07/02/2013 1739   CALCIUM 9.4 07/02/2013 1739    Lab Results  Component Value Date   ALT 8 07/02/2013   AST 15 07/02/2013   ALKPHOS 52 07/02/2013   BILITOT 0.9 07/02/2013    Lab Results  Component Value Date   TSH 2.61 03/30/2010    Lab Results  Component Value Date   PSA 1.07 07/02/2013   PSA 0.87 07/03/2012   PSA 0.83  06/29/2011    Assessment & Plan:   Health Maintenance Exam: The patient's preventative maintenance and recommended screening tests for an annual wellness exam were reviewed in full today. Brought up to date unless services declined.  Counselled on the importance of diet, exercise, and its role in overall health and mortality. The patient's FH and SH was reviewed, including their home life, tobacco status, and drug and alcohol status.  He is basically doing perfectly well. He has some aches and pains. He also works a lot, which if they contributes to his overall stress and situation.   Hannah Beat, MD 07/03/2013, 3:04 PM

## 2013-09-23 ENCOUNTER — Encounter: Payer: Self-pay | Admitting: Family Medicine

## 2013-09-23 ENCOUNTER — Ambulatory Visit (INDEPENDENT_AMBULATORY_CARE_PROVIDER_SITE_OTHER): Payer: BC Managed Care – PPO | Admitting: Family Medicine

## 2013-09-23 VITALS — BP 110/76 | HR 77 | Temp 98.1°F | Ht 64.5 in | Wt 179.5 lb

## 2013-09-23 DIAGNOSIS — R1013 Epigastric pain: Secondary | ICD-10-CM

## 2013-09-23 DIAGNOSIS — K921 Melena: Secondary | ICD-10-CM

## 2013-09-23 NOTE — Patient Instructions (Signed)
REFERRALS TO SPECIALISTS, SPECIAL TESTS (MRI, CT, ULTRASOUNDS)  GO THE WAITING ROOM AND TELL CHECK IN YOU NEED HELP WITH A REFERRAL. Either MARION or LINDA will help you set it up.  If it is between 1-2 PM they may be at lunch.  After 5 PM, they will likely be at home.  They will call you, so please make sure the office has your correct phone number.  Referrals sometimes can be done same day if urgent, but others can take 2 or 3 days to get an appointment. Starting in 2015, many of the new Medicare insurance plans and Affordable Care Act (Obamacare) Health plans offered take much longer for referrals. They have added additional paperwork and steps.  MRI's and CT's can take up to a week for the test. (Emergencies like strokes take precedence. I will tell you if you have an emergency.)   If your referral is to an in-network [place] office, their office may contact you directly prior to our office reaching you.  -- Examples: [facility], [facility], [facility], [facility], [facility], and many more.  Specialist appointment times vary a great deal, mostly on the specialist's schedule and if they have openings. -- Our office tries to get you in as fast as possible. -- Some specialists have very long wait times. (Example. Dermatology. Usually months) -- If you have a true emergency like new cancer, we work to get you in ASAP.   

## 2013-09-23 NOTE — Progress Notes (Signed)
Date:  09/23/2013   Name:  Roger Ferguson   DOB:  Mar 11, 1966   MRN:  [id number]  Primary Physician:  Owens Loffler, MD   Chief Complaint: Gastrophageal Reflux and Abdominal Pain   Subjective:   History of Present Illness:  Roger Ferguson is a 48 y.o. pleasant patient who presents with the following:  Having a lot of gas, and after every food eating would get a lot of - thought maybe lactose intolerant.   Went to [place] for 8 or 9 days. epigsatric discomfort and acid was really bad and pain in the chest.   Saw a doctor there in [place]. ? H. Pylori. Gave some Zyrtec - did not have reflux after that. Now two days stool was black.  He was given 2 different pills x 1 dose for h. Pylori? And improved for a while, but then his symptoms come back.   Day before yesterday, was still getting some pain. Feeling bloated.  Melena. In the last couple of weeks, he also developed some melena which has since resolved.   Patient Active Problem List   Diagnosis Date Noted  . GERD (gastroesophageal reflux disease) 02/13/2013  . Scapholunate Dissociation 10/16/2011  . Patellofemoral syndrome 12/30/2010  . UNSPECIFIED VITAMIN B DEFICIENCY 03/30/2010  . UNSPECIFIED VITAMIN D DEFICIENCY 03/30/2010  . BACK PAIN 10/26/2009  . FATIGUE 10/26/2009  . ANAL FISSURE 01/15/2009  . OTHER SPECIFIED DISORDER OF RECTUM AND ANUS 01/15/2009    Past Medical History  Diagnosis Date  . Anal itching     chronic  . History of hematuria     cystoscopy neg, rads eval  . 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 GERD (gastroesophageal reflux disease)     Past Surgical History  Procedure Laterality Date  . Rectal botox injection  09-2008    History   Social History  . Marital Status: Married    Spouse Name: N/A    Number of Children: 2  . Years of Education: 12+   Occupational History  . Psychiatric nurse.  .     Social History Main Topics  . Smoking status: Current Some Day Smoker  .  Smokeless tobacco: Never Used  . Alcohol Use: Yes  . Drug Use: No  . Sexual Activity: Yes    Partners: Female   Other Topics Concern  . Not on file   Social History Narrative   Regular exercise---no      Originally from [place]             No family history on file.  No Known Allergies  Medication list has been reviewed and updated.  Review of Systems:   GEN: No acute illnesses, no fevers, chills. GI: as above Pulm: No SOB Interactive and getting along well at home.  Otherwise, ROS is as per the HPI.  Objective:   Physical Examination: BP 110/76  Pulse 77  Temp(Src) 98.1 F (36.7 C) (Oral)  Ht 5' 4.5" (1.638 m)  Wt 179 lb 8 oz (81.421 kg)  BMI 30.35 kg/m2  Ideal Body Weight: Weight in (lb) to have BMI = 25: 147.6   GEN: WDWN, NAD, Non-toxic, A & O x 3 HEENT: Atraumatic, Normocephalic. Neck supple. No masses, No LAD. Ears and Nose: No external deformity. CV: RRR, No M/G/R. No JVD. No thrill. No extra heart sounds. PULM: CTA B, no wheezes, crackles, rhonchi. No retractions. No resp. distress. No accessory muscle use. ABD: S, NT, ND, + BS, No  rebound, No HSM  EXTR: No c/c/e NEURO Normal gait.  PSYCH: Normally interactive. Conversant. Not depressed or anxious appearing.  Calm demeanor.   Laboratory and Imaging Data:  Assessment & Plan:   Abdominal pain, epigastric - Plan: Helicobacter pylori special antigen, CANCELED: Helicobacter pylori antigen det, stool, CANCELED: Ambulatory referral to Gastroenterology  Melena - Plan: Helicobacter pylori special antigen, CANCELED: Helicobacter pylori antigen det, stool, CANCELED: Ambulatory referral to Gastroenterology  At this point, with sx x close to 1 year, I think he should see GI. He would like to see Dr. Carmell Austria, and will make his own appointment. If they need any additional help, then we would be happy to assist.  Check for H Pylori and treat if positive.   Orders Placed This Encounter  Procedures  .  Helicobacter pylori special antigen   Patient's Medications  New Prescriptions   No medications on file  Previous Medications   B COMPLEX VITAMINS TABLET    Take 1 tablet by mouth daily.   CHOLECALCIFEROL (VITAMIN D PO)    Take by mouth. As directed   MULTIPLE VITAMIN (MULTIVITAMIN) TABLET    Take 1 tablet by mouth daily.   PANTOPRAZOLE (PROTONIX) 40 MG TABLET    Take 1 tablet (40 mg total) by mouth daily.  Modified Medications   No medications on file  Discontinued Medications   CYANOCOBALAMIN (VITAMIN B-12 PO)    Take one by mouth daily   GLUCOSAMINE-CHONDROITIN (GLUCOSAMINE CHONDR COMPLEX PO)    Take 1 tablet by mouth daily.   Patient Instructions  REFERRALS TO SPECIALISTS, SPECIAL TESTS (MRI, CT, ULTRASOUNDS)  GO THE WAITING ROOM AND TELL CHECK IN YOU NEED HELP WITH A REFERRAL. Either MARION or LINDA will help you set it up.  If it is between 1-2 PM they may be at lunch.  After 5 PM, they will likely be at home.  They will call you, so please make sure the office has your correct phone number.  Referrals sometimes can be done same day if urgent, but others can take 2 or 3 days to get an appointment. Starting in 2015, many of the new Medicare insurance plans and [place] (Obamacare) Health plans offered take much longer for referrals. They have added additional paperwork and steps.  MRI's and CT's can take up to a week for the test. (Emergencies like strokes take precedence. I will tell you if you have an emergency.)   If your referral is to an [facility] office, their office may contact you directly prior to our office reaching you.  -- Examples: [facility], [facility], [place], [place]            Neurology, [facility], and many more.  Specialist appointment times vary a great deal, mostly on the specialist's schedule and if they have openings. -- Our office tries to get you in as fast as possible. -- Some  specialists have very long wait times. (Example. Dermatology. Usually months) -- If you have a true emergency like new cancer, we work to get you in ASAP.     Signed,  Maud Deed. Rejoice Heatwole, MD, [facility] [place] [address] Phone: [phone] Fax: [phone]

## 2013-09-23 NOTE — Progress Notes (Signed)
Pre visit review using our clinic review tool, if applicable. No additional management support is needed unless otherwise documented below in the visit note. 

## 2013-09-24 ENCOUNTER — Telehealth: Payer: Self-pay | Admitting: Family Medicine

## 2013-09-24 NOTE — Telephone Encounter (Signed)
Relevant patient education mailed to patient.  

## 2013-09-25 LAB — HELICOBACTER PYLORI  SPECIAL ANTIGEN: H. PYLORI ANTIGEN STOOL: NEGATIVE

## 2013-10-02 ENCOUNTER — Telehealth: Payer: Self-pay

## 2013-10-02 NOTE — Telephone Encounter (Signed)
Left message for Mr. Heal that I would have Dr. Diona Browner address this request on Thursday when she return to clinic.

## 2013-10-02 NOTE — Telephone Encounter (Signed)
Pt said having lower back pain and muscle spasms that just started today; pt said does not hurt while sitting but pain upon movement. Pt said in 2011 Dr Lorelei Pont prescribed Robaxin 500 mg taking one pill three times a day prn for muscle spasm and Voltaren 75 mg. Not on pts current or historical med list. Pt request med called in today to [address]. Pt request cb.

## 2013-10-02 NOTE — Telephone Encounter (Signed)
Since previous episode  Was in 2011, he needs to be seen prior to prescribing medication to make sure it is appropriate for ongoing issue.  This is for patient safety that we are not missing something else.

## 2013-10-02 NOTE — Telephone Encounter (Signed)
I would run this by Dr. Lorelei Pont or Dr. Diona Browner if he isn't available. Routed to both.

## 2013-10-03 NOTE — Telephone Encounter (Signed)
Spoke with wife and advised Roger Ferguson would need to be seen before anything can be prescribed for his back.

## 2013-10-05 NOTE — Telephone Encounter (Signed)
i am completely fine with writing for this over the phone.   voltaren 75 mg, 1 po bid, #60, 2 ref Robaxin 500 mg, 1 po tid prn muscle spasm, #50, 2 ref  I was out of town last week - my partners and I do not always do everything the same.  Hope you are well.  Owens Loffler, MD

## 2013-10-07 MED ORDER — DICLOFENAC SODIUM 75 MG PO TBEC: 75.0000 mg | DELAYED_RELEASE_TABLET | Freq: Two times a day (BID) | ORAL | Status: DC

## 2013-10-07 MED ORDER — METHOCARBAMOL 500 MG PO TABS: 500.0000 mg | ORAL_TABLET | Freq: Three times a day (TID) | ORAL | Status: DC

## 2013-10-07 NOTE — Telephone Encounter (Signed)
Mr. Matters notified as instructed by telephone.

## 2013-10-07 NOTE — Telephone Encounter (Signed)
Left message for Roger Ferguson to return my call.

## 2013-10-07 NOTE — Addendum Note (Signed)
Addended by: Carter Kitten on: 10/07/2013 08:30 AM   Modules accepted: Orders

## 2013-10-16 ENCOUNTER — Telehealth: Payer: Self-pay

## 2013-10-16 NOTE — Telephone Encounter (Signed)
Pt request a letter stating he had CPX on 07/03/13 for his employer; affects his ins premium. Pt notified done and at front desk for pick up.

## 2014-04-24 ENCOUNTER — Ambulatory Visit: Payer: BC Managed Care – PPO

## 2014-04-24 ENCOUNTER — Ambulatory Visit (INDEPENDENT_AMBULATORY_CARE_PROVIDER_SITE_OTHER): Payer: BC Managed Care – PPO

## 2014-04-24 DIAGNOSIS — Z23 Encounter for immunization: Secondary | ICD-10-CM

## 2014-06-19 ENCOUNTER — Ambulatory Visit (INDEPENDENT_AMBULATORY_CARE_PROVIDER_SITE_OTHER): Payer: BC Managed Care – PPO | Admitting: Family Medicine

## 2014-06-19 ENCOUNTER — Encounter: Payer: Self-pay | Admitting: Family Medicine

## 2014-06-19 VITALS — BP 110/66 | HR 74 | Temp 98.8°F | Ht 64.5 in | Wt 185.0 lb

## 2014-06-19 DIAGNOSIS — M545 Low back pain, unspecified: Secondary | ICD-10-CM

## 2014-06-19 DIAGNOSIS — D179 Benign lipomatous neoplasm, unspecified: Secondary | ICD-10-CM

## 2014-06-19 NOTE — Progress Notes (Signed)
Pre visit review using our clinic review tool, if applicable. No additional management support is needed unless otherwise documented below in the visit note. 

## 2014-06-19 NOTE — Progress Notes (Signed)
   Dr. Frederico Hamman T. Charon Smedberg, MD, [facility] [place], [address] Phone: [phone] Fax: [phone]  06/19/2014  Patient: Roger Ferguson, MRN: [id number], DOB: 05/19/1966, 48 y.o.  Primary Physician:  Owens Loffler, MD  Chief Complaint: Back Pain  Subjective:   Shai Mckenzie is a 48 y.o. very pleasant male patient who presents with the following:  When sitting, will have some left pain, and he will a little knot on the left side.   He also is been having some back pain intermittently, more on the left side. His father has stage IV lung cancer, and he is been stressed out a lot, gaining weight, and he has not been exercising much.  Past Medical History, Surgical History, Social History, Family History, Problem List, Medications, and Allergies have been reviewed and updated if relevant.  GEN: No fevers, chills. Nontoxic. Primarily MSK c/o today. MSK: Detailed in the HPI GI: tolerating PO intake without difficulty Neuro: No numbness, parasthesias, or tingling associated. Otherwise the pertinent positives of the ROS are noted above.   Objective:   BP 110/66 mmHg  Pulse 74  Temp(Src) 98.8 F (37.1 C) (Oral)  Ht 5' 4.5" (1.638 m)  Wt 185 lb (83.915 kg)  BMI 31.28 kg/m2   GEN: WDWN, NAD, Non-toxic, A & O x 3 HEENT: Atraumatic, Normocephalic. Neck supple. No masses, No LAD. Ears and Nose: No external deformity. CV: RRR, No M/G/R. No JVD. No thrill. No extra heart sounds. PULM: CTA B, no wheezes, crackles, rhonchi. No retractions. No resp. distress. No accessory muscle use. EXTR: No c/c/e NEURO Normal gait.  PSYCH: Normally interactive. Conversant. Not depressed or anxious appearing.  Calm demeanor.    There is a freely mobile lipoma on the left lower back region.  Low back, left greater than right is tender to palpation in the paraspinous musculature. Nontender in the spinous processes, and the patient has pain in his  left SI joint.  Radiology: No results found.  Assessment and Plan:   Left-sided low back pain without sciatica  Lipoma  I reassured him about his lipoma.  Recommended that he continue exercise, get back in better shape, lose weight, and start doing some basic hip and back stretches. Explained some basic SI maneuvers to  Follow-up: No Follow-up on file.  New Prescriptions   No medications on file   No orders of the defined types were placed in this encounter.    Signed,  Maud Deed. Armstead Heiland, MD   Patient's Medications  New Prescriptions   No medications on file  Previous Medications   B COMPLEX VITAMINS TABLET    Take 1 tablet by mouth daily.   CHOLECALCIFEROL (VITAMIN D PO)    Take by mouth. As directed   DEXILANT 60 MG CAPSULE    Take 60 mg by mouth daily.    DICLOFENAC (VOLTAREN) 75 MG EC TABLET    Take 1 tablet (75 mg total) by mouth 2 (two) times daily.   METHOCARBAMOL (ROBAXIN) 500 MG TABLET    Take 1 tablet (500 mg total) by mouth 3 (three) times daily.   MULTIPLE VITAMIN (MULTIVITAMIN) TABLET    Take 1 tablet by mouth daily.  Modified Medications   No medications on file  Discontinued Medications   PANTOPRAZOLE (PROTONIX) 40 MG TABLET    Take 1 tablet (40 mg total) by mouth daily.

## 2014-06-30 ENCOUNTER — Other Ambulatory Visit (INDEPENDENT_AMBULATORY_CARE_PROVIDER_SITE_OTHER): Payer: BC Managed Care – PPO

## 2014-06-30 DIAGNOSIS — E785 Hyperlipidemia, unspecified: Secondary | ICD-10-CM

## 2014-06-30 DIAGNOSIS — E559 Vitamin D deficiency, unspecified: Secondary | ICD-10-CM

## 2014-06-30 DIAGNOSIS — Z79899 Other long term (current) drug therapy: Secondary | ICD-10-CM

## 2014-06-30 DIAGNOSIS — Z125 Encounter for screening for malignant neoplasm of prostate: Secondary | ICD-10-CM

## 2014-06-30 LAB — CBC WITH DIFFERENTIAL/PLATELET
BASOS PCT: 0.8 % (ref 0.0–3.0)
Basophils Absolute: 0.1 10*3/uL (ref 0.0–0.1)
EOS ABS: 0.5 10*3/uL (ref 0.0–0.7)
Eosinophils Relative: 6.4 % — ABNORMAL HIGH (ref 0.0–5.0)
HEMATOCRIT: 45.5 % (ref 39.0–52.0)
HEMOGLOBIN: 14.7 g/dL (ref 13.0–17.0)
LYMPHS ABS: 2.6 10*3/uL (ref 0.7–4.0)
Lymphocytes Relative: 37 % (ref 12.0–46.0)
MCHC: 32.3 g/dL (ref 30.0–36.0)
MCV: 88.7 fl (ref 78.0–100.0)
MONO ABS: 0.6 10*3/uL (ref 0.1–1.0)
Monocytes Relative: 8.1 % (ref 3.0–12.0)
Neutro Abs: 3.4 10*3/uL (ref 1.4–7.7)
Neutrophils Relative %: 47.7 % (ref 43.0–77.0)
Platelets: 235 10*3/uL (ref 150.0–400.0)
RBC: 5.13 Mil/uL (ref 4.22–5.81)
RDW: 12.9 % (ref 11.5–15.5)
WBC: 7.1 10*3/uL (ref 4.0–10.5)

## 2014-06-30 LAB — HEPATIC FUNCTION PANEL
ALBUMIN: 3.8 g/dL (ref 3.5–5.2)
ALK PHOS: 54 U/L (ref 39–117)
ALT: 7 U/L (ref 0–53)
AST: 16 U/L (ref 0–37)
BILIRUBIN DIRECT: 0.1 mg/dL (ref 0.0–0.3)
TOTAL PROTEIN: 6.2 g/dL (ref 6.0–8.3)
Total Bilirubin: 0.9 mg/dL (ref 0.2–1.2)

## 2014-06-30 LAB — LIPID PANEL
CHOLESTEROL: 145 mg/dL (ref 0–200)
HDL: 30.8 mg/dL — AB (ref >39.00)
LDL Cholesterol: 76 mg/dL (ref 0–99)
NonHDL: 114.2
TRIGLYCERIDES: 189 mg/dL — AB (ref 0.0–149.0)
Total CHOL/HDL Ratio: 5
VLDL: 37.8 mg/dL (ref 0.0–40.0)

## 2014-06-30 LAB — BASIC METABOLIC PANEL
BUN: 12 mg/dL (ref 6–23)
CO2: 29 mEq/L (ref 19–32)
Calcium: 9.1 mg/dL (ref 8.4–10.5)
Chloride: 103 mEq/L (ref 96–112)
Creatinine, Ser: 1.1 mg/dL (ref 0.4–1.5)
GFR: 76.47 mL/min (ref >60.00)
Glucose, Bld: 100 mg/dL — ABNORMAL HIGH (ref 70–99)
Potassium: 4 mEq/L (ref 3.5–5.1)
Sodium: 138 mEq/L (ref 135–145)

## 2014-07-01 LAB — VITAMIN D 25 HYDROXY (VIT D DEFICIENCY, FRACTURES): VITD: 32.73 ng/mL (ref 30.00–100.00)

## 2014-07-01 LAB — PSA: PSA: 0.9 ng/mL (ref 0.10–4.00)

## 2014-07-07 ENCOUNTER — Encounter: Payer: BC Managed Care – PPO | Admitting: Family Medicine

## 2014-07-09 ENCOUNTER — Ambulatory Visit (INDEPENDENT_AMBULATORY_CARE_PROVIDER_SITE_OTHER): Payer: BC Managed Care – PPO | Admitting: Family Medicine

## 2014-07-09 ENCOUNTER — Encounter: Payer: Self-pay | Admitting: Family Medicine

## 2014-07-09 VITALS — BP 114/70 | HR 62 | Temp 98.0°F | Ht 65.0 in | Wt 181.2 lb

## 2014-07-09 DIAGNOSIS — Z0000 Encounter for general adult medical examination without abnormal findings: Secondary | ICD-10-CM

## 2014-07-09 MED ORDER — HYDROCORTISONE 2.5 % EX OINT: TOPICAL_OINTMENT | Freq: Two times a day (BID) | CUTANEOUS | Status: DC

## 2014-07-09 NOTE — Progress Notes (Signed)
Pre visit review using our clinic review tool, if applicable. No additional management support is needed unless otherwise documented below in the visit note. 

## 2014-07-09 NOTE — Progress Notes (Signed)
Dr. Frederico Hamman T. Caroline Longie, MD, [facility] [place], [address] Phone: [phone] Fax: [phone]  07/09/2014  Patient: Roger Ferguson, MRN: [id number], DOB: 1966/02/14, 48 y.o.  Primary Physician:  Owens Loffler, MD  Chief Complaint: Annual Exam  Subjective:   Roger Ferguson is a 48 y.o. pleasant patient who presents with the following:  Preventative Health Maintenance Visit:  Health Maintenance Summary Reviewed and updated, unless pt declines services.  Tobacco History Reviewed. Alcohol: No concerns, no excessive use Exercise Habits: none STD concerns: no risk or activity to increase risk Drug Use: None Encouraged self-testicular check  Ongoing issues with his father's stage IV lung cancer, some ongoing mild depression.  Detailed below.  Health Maintenance  Topic Date Due  . INFLUENZA VACCINE  02/09/2015  . TETANUS/TDAP  06/07/2020   Immunization History  Administered Date(s) Administered  . Influenza Split 03/25/2012  . Influenza Whole 04/24/2010  . Influenza,inj,Quad PF,36+ Mos 04/24/2014  . Influenza-Unspecified 03/25/2013  . Td 06/07/2010   Patient Active Problem List   Diagnosis Date Noted  . GERD (gastroesophageal reflux disease) 02/13/2013  . Scapholunate Dissociation 10/16/2011  . Patellofemoral syndrome 12/30/2010  . UNSPECIFIED VITAMIN B DEFICIENCY 03/30/2010  . UNSPECIFIED VITAMIN D DEFICIENCY 03/30/2010  . Backache 10/26/2009  . ANAL FISSURE 01/15/2009  . OTHER SPECIFIED DISORDER OF RECTUM AND ANUS 01/15/2009   Past Medical History  Diagnosis Date  . Anal itching     chronic  . History of hematuria     cystoscopy neg, rads eval  . 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 GERD (gastroesophageal reflux disease)    Past Surgical History  Procedure Laterality Date  . Rectal botox injection  09-2008   History   Social History  . Marital Status: Married    Spouse Name: N/A    Number of  Children: 2  . Years of Education: 12+   Occupational History  . Psychiatric nurse.  .     Social History Main Topics  . Smoking status: Former Research scientist (life sciences)  . Smokeless tobacco: Never Used  . Alcohol Use: 0.0 oz/week    0 Not specified per week  . Drug Use: No  . Sexual Activity:    Partners: Female   Other Topics Concern  . Not on file   Social History Narrative   Regular exercise---no      Originally from [place]            No family history on file. No Known Allergies  Medication list has been reviewed and updated.   General: Denies fever, chills, sweats. No significant weight loss. Eyes: Denies blurring,significant itching ENT: Denies earache, sore throat, and hoarseness. Cardiovascular: Denies chest pains, palpitations, dyspnea on exertion Respiratory: Denies cough, dyspnea at rest,wheeezing Breast: no concerns about lumps GI: Denies nausea, vomiting, diarrhea, constipation, change in bowel habits, abdominal pain, melena, hematochezia GU: Denies penile discharge, ED, urinary flow / outflow problems. No STD concerns. Musculoskeletal: Denies back pain, joint pain Derm: Denies rash, itching Neuro: Denies  paresthesias, frequent falls, frequent headaches Psych: On reflection, the patient has been having some problems with some depression over the last few years.  His sleep is diminished somewhat, he has some decreased interest in doing things outside of the home.  He is still working about 60 or 70 hours a week.  No suicidality or homicidality Endocrine: Denies cold intolerance, heat intolerance, polydipsia Heme: Denies enlarged lymph nodes Allergy:  No hayfever  Objective:   BP 114/70 mmHg  Pulse 62  Temp(Src) 98 F (36.7 C) (Oral)  Ht $R'5\' 5"'dF$  (1.651 m)  Wt 181 lb 4 oz (82.214 kg)  BMI 30.16 kg/m2  SpO2 96% Ideal Body Weight: Weight in (lb) to have BMI = 25: 149.9  No exam data present  GEN: well developed, well nourished, no acute  distress Eyes: conjunctiva and lids normal, PERRLA, EOMI ENT: TM clear, nares clear, oral exam WNL Neck: supple, no lymphadenopathy, no thyromegaly, no JVD Pulm: clear to auscultation and percussion, respiratory effort normal CV: regular rate and rhythm, S1-S2, no murmur, rub or gallop, no bruits, peripheral pulses normal and symmetric, no cyanosis, clubbing, edema or varicosities GI: soft, non-tender; no hepatosplenomegaly, masses; active bowel sounds all quadrants GU: no hernia, testicular mass, penile discharge Lymph: no cervical, axillary or inguinal adenopathy MSK: gait normal, muscle tone and strength WNL, no joint swelling, effusions, discoloration, crepitus  SKIN: clear, good turgor, color WNL, no rashes, lesions, or ulcerations Neuro: normal mental status, normal strength, sensation, and motion Psych: alert; oriented to person, place and time, normally interactive and not anxious or depressed in appearance. All labs reviewed with patient.  Lipids:    Component Value Date/Time   CHOL 145 06/30/2014 0836   TRIG 189.0* 06/30/2014 0836   HDL 30.80* 06/30/2014 0836   VLDL 37.8 06/30/2014 0836   CHOLHDL 5 06/30/2014 0836   CBC: CBC Latest Ref Rng 06/30/2014 07/02/2013 07/03/2012  WBC 4.0 - 10.5 K/uL 7.1 6.3 7.4  Hemoglobin 13.0 - 17.0 g/dL 14.7 15.8 15.3  Hematocrit 39.0 - 52.0 % 45.5 46.2 46.3  Platelets 150.0 - 400.0 K/uL 235.0 256 315.4    Basic Metabolic Panel:    Component Value Date/Time   NA 138 06/30/2014 0836   K 4.0 06/30/2014 0836   CL 103 06/30/2014 0836   CO2 29 06/30/2014 0836   BUN 12 06/30/2014 0836   CREATININE 1.1 06/30/2014 0836   CREATININE 0.96 07/02/2013 1739   GLUCOSE 100* 06/30/2014 0836   CALCIUM 9.1 06/30/2014 0836   Hepatic Function Latest Ref Rng 06/30/2014 07/02/2013 07/03/2012  Total Protein 6.0 - 8.3 g/dL 6.2 6.5 6.5  Albumin 3.5 - 5.2 g/dL 3.8 4.3 4.1  AST 0 - 37 U/L $Remo'16 15 17  'XQapC$ ALT 0 - 53 U/L $Remo'7 8 13  'WQtmT$ Alk Phosphatase 39 - 117 U/L 54  52 51  Total Bilirubin 0.2 - 1.2 mg/dL 0.9 0.9 1.0  Bilirubin, Direct 0.0 - 0.3 mg/dL 0.1 0.2 0.2    Lab Results  Component Value Date   TSH 2.61 03/30/2010   Lab Results  Component Value Date   PSA 0.90 06/30/2014   PSA 1.07 07/02/2013   PSA 0.87 07/03/2012    Assessment and Plan:   Healthcare maintenance  Health Maintenance Exam: The patient's preventative maintenance and recommended screening tests for an annual wellness exam were reviewed in full today. Brought up to date unless services declined.  Counselled on the importance of diet, exercise, and its role in overall health and mortality. The patient's FH and SH was reviewed, including their home life, tobacco status, and drug and alcohol status.  Seborrheic dermatitis, eyebrows: Hydrocortisone 2.5%  He is having some mild depression.  At this point he is not ready to start any kind of medication, but he is open to trying to do more aerobic exercise.  Right now he is not really open to doing some counseling and he does not really have time  to do so.  Some of this may be doing of his father who is in stage IV now with lung cancer and living with them.  This is been a difficult process and his father is in pain.  Follow-up: No Follow-up on file. Unless noted, follow-up in 1 year for Health Maintenance Exam.  New Prescriptions   HYDROCORTISONE 2.5 % OINTMENT    Apply topically 2 (two) times daily.   No orders of the defined types were placed in this encounter.    Signed,  Maud Deed. Annaliah Rivenbark, MD   Patient's Medications  New Prescriptions   HYDROCORTISONE 2.5 % OINTMENT    Apply topically 2 (two) times daily.  Previous Medications   B COMPLEX VITAMINS TABLET    Take 1 tablet by mouth daily.   CHOLECALCIFEROL (VITAMIN D PO)    Take by mouth. As directed   DEXILANT 60 MG CAPSULE    Take 60 mg by mouth daily.    DICLOFENAC (VOLTAREN) 75 MG EC TABLET    Take 1 tablet (75 mg total) by mouth 2 (two) times daily.    METHOCARBAMOL (ROBAXIN) 500 MG TABLET    Take 1 tablet (500 mg total) by mouth 3 (three) times daily.   MULTIPLE VITAMIN (MULTIVITAMIN) TABLET    Take 1 tablet by mouth daily.  Modified Medications   No medications on file  Discontinued Medications   No medications on file

## 2014-07-17 ENCOUNTER — Encounter: Payer: Self-pay | Admitting: Family Medicine

## 2014-07-17 ENCOUNTER — Ambulatory Visit (INDEPENDENT_AMBULATORY_CARE_PROVIDER_SITE_OTHER): Payer: BLUE CROSS/BLUE SHIELD | Admitting: Family Medicine

## 2014-07-17 VITALS — BP 110/74 | HR 83 | Temp 98.8°F | Ht 65.0 in | Wt 182.5 lb

## 2014-07-17 DIAGNOSIS — J069 Acute upper respiratory infection, unspecified: Secondary | ICD-10-CM

## 2014-07-17 MED ORDER — AMOXICILLIN 500 MG PO CAPS: 1000.0000 mg | ORAL_CAPSULE | Freq: Two times a day (BID) | ORAL | Status: DC

## 2014-07-17 NOTE — Progress Notes (Signed)
Pre visit review using our clinic review tool, if applicable. No additional management support is needed unless otherwise documented below in the visit note. 

## 2014-07-17 NOTE — Progress Notes (Signed)
Dr. Frederico Hamman T. Mihaela Fajardo, MD, [facility] [place], [address] Phone: [phone] Fax: [phone]  07/17/2014  Patient: Roger Ferguson, MRN: [id number], DOB: 03/18/1966, 49 y.o.  Primary Physician:  Owens Loffler, MD  Chief Complaint: Hoarse; Ear Pain; and Throat Pain  Subjective:   This 49 y.o. male patient presents with runny nose, sneezing, cough, sore throat, malaise and minimal / low-grade fever .   Since yesterday, throat was hurting in the middle of the night. Ears were hurting. Vincenza Hews took some nyquil and went to bed. Last night again, sore throat and voice is pretty bad.   His father has stage IV lung cancer, and was just found to have bony metastases  ? recent exposure to others with similar symptoms.   The patent denies sore throat as the primary complaint. Denies sthortness of breath/wheezing, high fever, chest pain, rhinits for more than 14 days, significant myalgia, otalgia, facial pain, abdominal pain, changes in bowel or bladder.  PMH, PHS, Allergies, Problem List, Medications, Family History, and Social History have all been reviewed.  ROS as above, eating and drinking - tolerating PO. Urinating normally. No excessive vomitting or diarrhea. O/w as above.  Objective:   Blood pressure 110/74, pulse 83, temperature 98.8 F (37.1 C), temperature source Oral, height 5\' 5"  (1.651 m), weight 182 lb 8 oz (82.781 kg).  GEN: WDWN, Non-toxic, Atraumatic, normocephalic. A and O x 3. HEENT: Oropharynx clear without exudate, MMM, no significant LAD, mild rhinnorhea Ears: TM clear, COL visualized with good landmarks, but more reddish and full on the R CV: RRR, no m/g/r. Pulm: CTA B, no wheezes, rhonchi, or crackles, normal respiratory effort. EXT: no c/c/e Psych: well oriented, neither depressed nor anxious in appearance  Objective Data: Results for orders placed or performed in visit on 06/30/14  Lipid panel    Result Value Ref Range   Cholesterol 145 0 - 200 mg/dL   Triglycerides 189.0 (H) 0.0 - 149.0 mg/dL   HDL 30.80 (L) >39.00 mg/dL   VLDL 37.8 0.0 - 40.0 mg/dL   LDL Cholesterol 76 0 - 99 mg/dL   Total CHOL/HDL Ratio 5    NonHDL 114.20   Hepatic function panel  Result Value Ref Range   Total Bilirubin 0.9 0.2 - 1.2 mg/dL   Bilirubin, Direct 0.1 0.0 - 0.3 mg/dL   Alkaline Phosphatase 54 39 - 117 U/L   AST 16 0 - 37 U/L   ALT 7 0 - 53 U/L   Total Protein 6.2 6.0 - 8.3 g/dL   Albumin 3.8 3.5 - 5.2 g/dL  CBC with Differential  Result Value Ref Range   WBC 7.1 4.0 - 10.5 K/uL   RBC 5.13 4.22 - 5.81 Mil/uL   Hemoglobin 14.7 13.0 - 17.0 g/dL   HCT 45.5 39.0 - 52.0 %   MCV 88.7 78.0 - 100.0 fl   MCHC 32.3 30.0 - 36.0 g/dL   RDW 12.9 11.5 - 15.5 %   Platelets 235.0 150.0 - 400.0 K/uL   Neutrophils Relative % 47.7 43.0 - 77.0 %   Lymphocytes Relative 37.0 12.0 - 46.0 %   Monocytes Relative 8.1 3.0 - 12.0 %   Eosinophils Relative 6.4 (H) 0.0 - 5.0 %   Basophils Relative 0.8 0.0 - 3.0 %   Neutro Abs 3.4 1.4 - 7.7 K/uL   Lymphs Abs 2.6 0.7 - 4.0 K/uL   Monocytes Absolute 0.6 0.1 - 1.0 K/uL   Eosinophils  Absolute 0.5 0.0 - 0.7 K/uL   Basophils Absolute 0.1 0.0 - 0.1 K/uL  Basic metabolic panel  Result Value Ref Range   Sodium 138 135 - 145 mEq/L   Potassium 4.0 3.5 - 5.1 mEq/L   Chloride 103 96 - 112 mEq/L   CO2 29 19 - 32 mEq/L   Glucose, Bld 100 (H) 70 - 99 mg/dL   BUN 12 6 - 23 mg/dL   Creatinine, Ser 1.1 0.4 - 1.5 mg/dL   Calcium 9.1 8.4 - 10.5 mg/dL   GFR 76.47 >60.00 mL/min  PSA  Result Value Ref Range   PSA 0.90 0.10 - 4.00 ng/mL  Vit D  25 hydroxy (rtn osteoporosis monitoring)  Result Value Ref Range   VITD 32.73 30.00 - 100.00 ng/mL    Assessment and Plan:   URI (upper respiratory infection)  Supportive care reviewed with patient. See patient instruction section.  Right ear may be an early developing otitis media.  Asked to hold antibiotics, and if right ear  worsens, start.  Otherwise supportive care.  Follow-up: No Follow-up on file.  New Prescriptions   AMOXICILLIN (AMOXIL) 500 MG CAPSULE    Take 2 capsules (1,000 mg total) by mouth 2 (two) times daily.   No orders of the defined types were placed in this encounter.    Signed,  Maud Deed. Vanshika Jastrzebski, MD   Patient's Medications  New Prescriptions   AMOXICILLIN (AMOXIL) 500 MG CAPSULE    Take 2 capsules (1,000 mg total) by mouth 2 (two) times daily.  Previous Medications   B COMPLEX VITAMINS TABLET    Take 1 tablet by mouth daily.   CHOLECALCIFEROL (VITAMIN D PO)    Take by mouth. As directed   DEXILANT 60 MG CAPSULE    Take 60 mg by mouth daily.    DICLOFENAC (VOLTAREN) 75 MG EC TABLET    Take 1 tablet (75 mg total) by mouth 2 (two) times daily.   HYDROCORTISONE 2.5 % OINTMENT    Apply topically 2 (two) times daily.   METHOCARBAMOL (ROBAXIN) 500 MG TABLET    Take 1 tablet (500 mg total) by mouth 3 (three) times daily.   MULTIPLE VITAMIN (MULTIVITAMIN) TABLET    Take 1 tablet by mouth daily.  Modified Medications   No medications on file  Discontinued Medications   No medications on file

## 2014-08-19 ENCOUNTER — Other Ambulatory Visit: Payer: Self-pay | Admitting: Gastroenterology

## 2014-08-19 DIAGNOSIS — R1013 Epigastric pain: Secondary | ICD-10-CM

## 2014-08-29 ENCOUNTER — Ambulatory Visit
Admission: RE | Admit: 2014-08-29 | Discharge: 2014-08-29 | Disposition: A | Discharge status: Home / Self Care | Payer: BLUE CROSS/BLUE SHIELD | Source: Ambulatory Visit | Attending: Gastroenterology | Admitting: Gastroenterology

## 2014-08-29 DIAGNOSIS — R1013 Epigastric pain: Secondary | ICD-10-CM

## 2015-01-27 ENCOUNTER — Ambulatory Visit (INDEPENDENT_AMBULATORY_CARE_PROVIDER_SITE_OTHER): Payer: BLUE CROSS/BLUE SHIELD | Admitting: Internal Medicine

## 2015-01-27 ENCOUNTER — Encounter: Payer: Self-pay | Admitting: Internal Medicine

## 2015-01-27 VITALS — BP 120/70 | HR 74 | Temp 97.7°F | Wt 179.1 lb

## 2015-01-27 DIAGNOSIS — B084 Enteroviral vesicular stomatitis with exanthem: Secondary | ICD-10-CM | POA: Diagnosis not present

## 2015-01-27 NOTE — Progress Notes (Signed)
Pre visit review using our clinic review tool, if applicable. No additional management support is needed unless otherwise documented below in the visit note. 

## 2015-01-27 NOTE — Patient Instructions (Signed)

## 2015-01-27 NOTE — Progress Notes (Signed)
Subjective:    Patient ID: Roger Ferguson, male    DOB: 03/01/1966, 49 y.o.   MRN: [id number]  HPI  Pt presents to the clinic today with c/o body aches, a rash on his hands and ulcers in his mouth. This started 3 days ago. The ulcers are very painful. The rash is not too painful. He denies fever but his calves and joints hurt. 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 He recently travelled from [place] and is not sure if that has anything to do with it. He has not had contacts with anyone who has had similar symptoms.  Review of Systems      Past Medical History  Diagnosis Date  . Anal itching     chronic  . History of hematuria     cystoscopy neg, rads eval  . 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 GERD (gastroesophageal reflux disease)     Current Outpatient Prescriptions  Medication Sig Dispense Refill  . amoxicillin (AMOXIL) 500 MG capsule Take 2 capsules (1,000 mg total) by mouth 2 (two) times daily. 40 capsule 0  . b complex vitamins tablet Take 1 tablet by mouth daily.    . Cholecalciferol (VITAMIN D PO) Take by mouth. As directed    . DEXILANT 60 MG capsule Take 60 mg by mouth daily.   10  . diclofenac (VOLTAREN) 75 MG EC tablet Take 1 tablet (75 mg total) by mouth 2 (two) times daily. 60 tablet 2  . hydrocortisone 2.5 % ointment Apply topically 2 (two) times daily. 30 g 2  . methocarbamol (ROBAXIN) 500 MG tablet Take 1 tablet (500 mg total) by mouth 3 (three) times daily. 50 tablet 2  . Multiple Vitamin (MULTIVITAMIN) tablet Take 1 tablet by mouth daily.    . ranitidine (ZANTAC) 150 MG tablet Take 150 mg by mouth once.     No current facility-administered medications for this visit.    No Known Allergies  No family history on file.  History   Social History  . Marital Status: Married    Spouse Name: N/A  . Number of Children: 2  . Years of Education: 12+   Occupational History  . Psychiatric nurse.  .     Social History Main  Topics  . Smoking status: Former Research scientist (life sciences)  . Smokeless tobacco: Never Used  . Alcohol Use: 0.0 oz/week    0 Standard drinks or equivalent per week  . Drug Use: No  . Sexual Activity:    Partners: Female   Other Topics Concern  . Not on file   Social History Narrative   Regular exercise---no      Originally from [place]   Married with 2 children              Constitutional: Denies fever, malaise, fatigue, headache or abrupt weight changes.  HEENT: Pt reports oral ulcers. Denies eye pain, eye redness, ear pain, ringing in the ears, wax buildup, runny nose, nasal congestion, bloody nose, or sore throat. Respiratory: Denies difficulty breathing, shortness of breath, cough or sputum production.   Cardiovascular: Denies chest pain, chest tightness, palpitations or swelling in the hands or feet.  Musculoskeletal: Pt reports body aches. Denies decrease in range of motion, difficulty with gait, muscle pain or joint pain and swelling.  Skin: Pt reports a rash on his hands. Denies ulcercations.    No other specific complaints in a complete review of systems (except as listed in  HPI above).  Objective:   Physical Exam    BP 120/70 mmHg  Pulse 74  Temp(Src) 97.7 F (36.5 C) (Oral)  Wt 179 lb 1.9 oz (81.248 kg)  SpO2 99% Wt Readings from Last 3 Encounters:  01/27/15 179 lb 1.9 oz (81.248 kg)  07/17/14 182 lb 8 oz (82.781 kg)  07/09/14 181 lb 4 oz (82.214 kg)    General: Appears his stated age, well developed, well nourished in NAD. Skin: Warm, dry and intact. Scattered papular lesions noted on bilateral hands and wrist. HEENT: Head: normal shape and size; Throat/Mouth: Teeth present, mucosa pink and moist. Scattered oral ulcers noted on posterior tongue and lower lip.  Neck: No adenopathy noted. Cardiovascular: Normal rate and rhythm. S1,S2 noted.  No murmur, rubs or gallops noted. Pulmonary/Chest: Normal effort and positive vesicular breath sounds. No respiratory  distress. No wheezes, rales or ronchi noted.  Neurological: Alert and oriented.   BMET    Component Value Date/Time   NA 138 06/30/2014 0836   K 4.0 06/30/2014 0836   CL 103 06/30/2014 0836   CO2 29 06/30/2014 0836   GLUCOSE 100* 06/30/2014 0836   BUN 12 06/30/2014 0836   CREATININE 1.1 06/30/2014 0836   CREATININE 0.96 07/02/2013 1739   CALCIUM 9.1 06/30/2014 0836   GFRNONAA 74.73 03/30/2010 0829    Lipid Panel     Component Value Date/Time   CHOL 145 06/30/2014 0836   TRIG 189.0* 06/30/2014 0836   HDL 30.80* 06/30/2014 0836   CHOLHDL 5 06/30/2014 0836   VLDL 37.8 06/30/2014 0836   LDLCALC 76 06/30/2014 0836    CBC    Component Value Date/Time   WBC 7.1 06/30/2014 0836   RBC 5.13 06/30/2014 0836   HGB 14.7 06/30/2014 0836   HCT 45.5 06/30/2014 0836   PLT 235.0 06/30/2014 0836   MCV 88.7 06/30/2014 0836   MCH 29.8 07/02/2013 1739   MCHC 32.3 06/30/2014 0836   RDW 12.9 06/30/2014 0836   LYMPHSABS 2.6 06/30/2014 0836   MONOABS 0.6 06/30/2014 0836   EOSABS 0.5 06/30/2014 0836   BASOSABS 0.1 06/30/2014 0836    Hgb A1C No results found for: HGBA1C     Assessment & Plan:   Hand, foot and mouth disease:  Discussed incubation period, symptomatology, transmission of the virus He understands that treatment is symptomatic He will continue Orajel He declines RX for viscous lidocaine  He will follow up with PCP if symptoms persist or worsen

## 2015-05-06 ENCOUNTER — Ambulatory Visit (INDEPENDENT_AMBULATORY_CARE_PROVIDER_SITE_OTHER): Payer: BLUE CROSS/BLUE SHIELD

## 2015-05-06 DIAGNOSIS — Z23 Encounter for immunization: Secondary | ICD-10-CM

## 2015-06-29 ENCOUNTER — Telehealth: Payer: Self-pay | Admitting: Family Medicine

## 2015-06-29 NOTE — Telephone Encounter (Signed)
Mr. Roger Ferguson notified to keep lab appointment on 07/01/2015.  Lab result turn around time is usually 24 hours and we will be happy to get his lab results to him for insurance purposes prior to his appointment on 07/15/2015.

## 2015-06-29 NOTE — Telephone Encounter (Signed)
Pt called regarding his cpe labs he is having drawn on 07/01/15. He asked if he could get the results before his 07/15/15 cpe appt as he needs to file his labs with his insurance before then end of the year for a program he is in. He said he can come in 06/30/15 if they would get the results back sooner, but he needs to know today so he can fast and plan to come in. Pt will need the documentation of his labs being drawn to submit to the insurance. He can be reached at [phone].

## 2015-06-29 NOTE — Telephone Encounter (Signed)
07/01/2015 should be fine - i will send them to him

## 2015-07-01 ENCOUNTER — Other Ambulatory Visit (INDEPENDENT_AMBULATORY_CARE_PROVIDER_SITE_OTHER): Payer: BLUE CROSS/BLUE SHIELD

## 2015-07-01 DIAGNOSIS — Z79899 Other long term (current) drug therapy: Secondary | ICD-10-CM | POA: Diagnosis not present

## 2015-07-01 DIAGNOSIS — Z13220 Encounter for screening for lipoid disorders: Secondary | ICD-10-CM

## 2015-07-01 DIAGNOSIS — E559 Vitamin D deficiency, unspecified: Secondary | ICD-10-CM

## 2015-07-01 DIAGNOSIS — E538 Deficiency of other specified B group vitamins: Secondary | ICD-10-CM

## 2015-07-01 DIAGNOSIS — Z125 Encounter for screening for malignant neoplasm of prostate: Secondary | ICD-10-CM | POA: Diagnosis not present

## 2015-07-01 LAB — LIPID PANEL
CHOLESTEROL: 148 mg/dL (ref 0–200)
HDL: 39.2 mg/dL (ref >39.00)
LDL CALC: 88 mg/dL (ref 0–99)
NONHDL: 108.62
Total CHOL/HDL Ratio: 4
Triglycerides: 103 mg/dL (ref 0.0–149.0)
VLDL: 20.6 mg/dL (ref 0.0–40.0)

## 2015-07-01 LAB — CBC WITH DIFFERENTIAL/PLATELET
BASOS PCT: 0 % (ref 0.0–3.0)
Basophils Absolute: 0 10*3/uL (ref 0.0–0.1)
Eosinophils Absolute: 0.4 10*3/uL (ref 0.0–0.7)
Eosinophils Relative: 5.1 % — ABNORMAL HIGH (ref 0.0–5.0)
HEMATOCRIT: 47.1 % (ref 39.0–52.0)
HEMOGLOBIN: 15.3 g/dL (ref 13.0–17.0)
LYMPHS PCT: 36 % (ref 12.0–46.0)
Lymphs Abs: 2.6 10*3/uL (ref 0.7–4.0)
MCHC: 32.5 g/dL (ref 30.0–36.0)
MCV: 86.1 fl (ref 78.0–100.0)
Monocytes Absolute: 0.4 10*3/uL (ref 0.1–1.0)
Monocytes Relative: 5.9 % (ref 3.0–12.0)
NEUTROS ABS: 3.9 10*3/uL (ref 1.4–7.7)
Neutrophils Relative %: 53 % (ref 43.0–77.0)
PLATELETS: 276 10*3/uL (ref 150.0–400.0)
RBC: 5.47 Mil/uL (ref 4.22–5.81)
RDW: 13.6 % (ref 11.5–15.5)
WBC: 7.4 10*3/uL (ref 4.0–10.5)

## 2015-07-01 LAB — BASIC METABOLIC PANEL
BUN: 12 mg/dL (ref 6–23)
CO2: 31 mEq/L (ref 19–32)
CREATININE: 1.09 mg/dL (ref 0.40–1.50)
Calcium: 9.5 mg/dL (ref 8.4–10.5)
Chloride: 103 mEq/L (ref 96–112)
GFR: 76.16 mL/min (ref >60.00)
GLUCOSE: 102 mg/dL — AB (ref 70–99)
Potassium: 4.1 mEq/L (ref 3.5–5.1)
Sodium: 140 mEq/L (ref 135–145)

## 2015-07-01 LAB — VITAMIN D 25 HYDROXY (VIT D DEFICIENCY, FRACTURES): VITD: 24.78 ng/mL — AB (ref 30.00–100.00)

## 2015-07-01 LAB — HEPATIC FUNCTION PANEL
ALBUMIN: 4.3 g/dL (ref 3.5–5.2)
ALT: 10 U/L (ref 0–53)
AST: 18 U/L (ref 0–37)
Alkaline Phosphatase: 65 U/L (ref 39–117)
BILIRUBIN TOTAL: 0.6 mg/dL (ref 0.2–1.2)
Bilirubin, Direct: 0.1 mg/dL (ref 0.0–0.3)
Total Protein: 6.8 g/dL (ref 6.0–8.3)

## 2015-07-01 LAB — PSA: PSA: 1.02 ng/mL (ref 0.10–4.00)

## 2015-07-02 ENCOUNTER — Other Ambulatory Visit (INDEPENDENT_AMBULATORY_CARE_PROVIDER_SITE_OTHER): Payer: BLUE CROSS/BLUE SHIELD

## 2015-07-02 ENCOUNTER — Telehealth: Payer: Self-pay | Admitting: Family Medicine

## 2015-07-02 DIAGNOSIS — E538 Deficiency of other specified B group vitamins: Secondary | ICD-10-CM

## 2015-07-02 LAB — VITAMIN B12: VITAMIN B 12: 167 pg/mL — AB (ref 211–911)

## 2015-07-02 NOTE — Telephone Encounter (Signed)
Mr. Biesinger notified lab results are ready to be picked up at the front desk.

## 2015-07-02 NOTE — Telephone Encounter (Signed)
Pt would like to pick up a copy of his lab results.  States that viewing on my chart, he cannot print what he needs for work.  Please call pt when ready for pickup.  He would like to pick up on 07/02/15 if possible

## 2015-07-15 ENCOUNTER — Ambulatory Visit (INDEPENDENT_AMBULATORY_CARE_PROVIDER_SITE_OTHER): Payer: BLUE CROSS/BLUE SHIELD | Admitting: Family Medicine

## 2015-07-15 ENCOUNTER — Encounter: Payer: Self-pay | Admitting: Family Medicine

## 2015-07-15 VITALS — BP 116/86 | HR 64 | Temp 98.2°F | Ht 65.0 in | Wt 180.8 lb

## 2015-07-15 DIAGNOSIS — Z0000 Encounter for general adult medical examination without abnormal findings: Secondary | ICD-10-CM

## 2015-07-15 DIAGNOSIS — Z1211 Encounter for screening for malignant neoplasm of colon: Secondary | ICD-10-CM | POA: Diagnosis not present

## 2015-07-15 MED ORDER — ASPIRIN EC 81 MG PO TBEC: 81.0000 mg | DELAYED_RELEASE_TABLET | Freq: Every day | ORAL | Status: DC

## 2015-07-15 MED ORDER — VITAMIN D 50 MCG (2000 UT) PO TABS: 2000.0000 [IU] | ORAL_TABLET | Freq: Every day | ORAL | Status: DC

## 2015-07-15 MED ORDER — FLUTICASONE PROPIONATE 0.005 % EX OINT
1.0000 | TOPICAL_OINTMENT | Freq: Two times a day (BID) | CUTANEOUS | Status: DC
Start: 2015-07-15 — End: 2016-06-01

## 2015-07-15 MED ORDER — TRAMADOL HCL 50 MG PO TABS: ORAL_TABLET | ORAL | Status: DC

## 2015-07-15 MED ORDER — CLOTRIMAZOLE 1 % EX CREA: 1.0000 "application " | TOPICAL_CREAM | Freq: Two times a day (BID) | CUTANEOUS | Status: DC

## 2015-07-15 MED ORDER — VITAMIN B-12 1000 MCG PO TABS: 1000.0000 ug | ORAL_TABLET | Freq: Every day | ORAL | Status: DC

## 2015-07-15 NOTE — Progress Notes (Signed)
Pre visit review using our clinic review tool, if applicable. No additional management support is needed unless otherwise documented below in the visit note. 

## 2015-07-15 NOTE — Progress Notes (Signed)
Dr. Frederico Hamman T. Damar Petit, MD, [facility] [place], [address] Phone: [phone] Fax: [phone]  07/15/2015  Patient: Roger Ferguson, Roger Ferguson: [id number], DOB: 11/25/1965, 50 y.o.  Primary Physician:  Owens Loffler, MD   Chief Complaint  Patient presents with  . Annual Exam   Subjective:   Roger Ferguson is a 50 y.o. pleasant patient who presents with the following:  Preventative Health Maintenance Visit:  Dad passed in March.  Wife and 2 children are ok  Gallbladder removed.  Still having some bad reflux  Will still have burning - take ssome fluticasone ointment 5%  Health Maintenance Summary Reviewed and updated, unless pt declines services.  Tobacco History Reviewed. Alcohol: No concerns, no excessive use Exercise Habits: rare activity, rec at least 30 mins 5 times a week STD concerns: no risk or activity to increase risk Drug Use: None Encouraged self-testicular check  Health Maintenance  Topic Date Due  . HIV Screening  08/27/1980  . INFLUENZA VACCINE  02/09/2016  . TETANUS/TDAP  06/07/2020   Immunization History  Administered Date(s) Administered  . Influenza Split 03/25/2012  . Influenza Whole 04/24/2010  . Influenza,inj,Quad PF,36+ Mos 04/24/2014, 05/06/2015  . Influenza-Unspecified 03/25/2013  . Td 06/07/2010   Patient Active Problem List   Diagnosis Date Noted  . GERD (gastroesophageal reflux disease) 02/13/2013  . 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 Patellofemoral syndrome 12/30/2010  . UNSPECIFIED VITAMIN B DEFICIENCY 03/30/2010  . UNSPECIFIED VITAMIN D DEFICIENCY 03/30/2010  . Backache 10/26/2009  . ANAL FISSURE 01/15/2009   Past Medical History  Diagnosis Date  . Anal itching     chronic  . History of hematuria     cystoscopy neg, rads eval  . 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 GERD (gastroesophageal reflux disease)    Past Surgical History  Procedure Laterality Date  .  Rectal botox injection  09-2008  . Laparoscopic cholecystectomy     Social History   Social History  . Marital Status: Married    Spouse Name: N/A  . Number of Children: 2  . Years of Education: 12+   Occupational History  . Psychiatric nurse.  .     Social History Main Topics  . Smoking status: Former Research scientist (life sciences)  . Smokeless tobacco: Never Used  . Alcohol Use: 0.0 oz/week    0 Standard drinks or equivalent per week  . Drug Use: No  . Sexual Activity:    Partners: Female   Other Topics Concern  . Not on file   Social History Narrative   Regular exercise---no      Originally from [place] ([place]   Married with 2 children            No family history on file. No Known Allergies  Medication list has been reviewed and updated.   General: Denies fever, chills, sweats. No significant weight loss. Eyes: Denies blurring,significant itching ENT: Denies earache, sore throat, and hoarseness. Cardiovascular: Denies chest pains, palpitations, dyspnea on exertion Respiratory: Denies cough, dyspnea at rest,wheeezing Breast: no concerns about lumps GI: Denies nausea, vomiting, diarrhea, constipation, change in bowel habits, abdominal pain, melena, hematochezia. occ anal irritation - cream does help GU: Denies penile discharge, ED, urinary flow / outflow problems. No STD concerns. Some early ejaculation. occ balanitis. Musculoskeletal: Denies back pain, joint pain Derm: Denies rash, itching Neuro: Denies  paresthesias, frequent falls, frequent headaches Psych: Denies depression, anxiety Endocrine: Denies cold intolerance, heat  intolerance, polydipsia Heme: Denies enlarged lymph nodes Allergy: No hayfever  Objective:   BP 116/86 mmHg  Pulse 64  Temp(Src) 98.2 F (36.8 C) (Oral)  Ht _0  (1.651 m)  Wt 180 lb 12 oz (81.988 kg)  BMI 30.08 kg/m2 Ideal Body Weight: Weight in (lb) to have BMI = 25: 149.9  No exam data present  GEN: well developed, well  nourished, no acute distress Eyes: conjunctiva and lids normal, PERRLA, EOMI ENT: TM clear, nares clear, oral exam WNL Neck: supple, no lymphadenopathy, no thyromegaly, no JVD Pulm: clear to auscultation and percussion, respiratory effort normal CV: regular rate and rhythm, S1-S2, no murmur, rub or gallop, no bruits, peripheral pulses normal and symmetric, no cyanosis, clubbing, edema or varicosities GI: soft, non-tender; no hepatosplenomegaly, masses; active bowel sounds all quadrants GU: no hernia, testicular mass, penile discharge Lymph: no cervical, axillary or inguinal adenopathy MSK: gait normal, muscle tone and strength WNL, no joint swelling, effusions, discoloration, crepitus  SKIN: clear, good turgor, color WNL, no rashes, lesions, or ulcerations Neuro: normal mental status, normal strength, sensation, and motion Psych: alert; oriented to person, place and time, normally interactive and not anxious or depressed in appearance. All labs reviewed with patient.  Lipids:    Component Value Date/Time   CHOL 148 07/01/2015 1345   TRIG 103.0 07/01/2015 1345   HDL 39.20 07/01/2015 1345   VLDL 20.6 07/01/2015 1345   CHOLHDL 4 07/01/2015 1345   CBC: CBC Latest Ref Rng 07/01/2015 06/30/2014 07/02/2013  WBC 4.0 - 10.5 K/uL 7.4 7.1 6.3  Hemoglobin 13.0 - 17.0 g/dL 15.3 14.7 15.8  Hematocrit 39.0 - 52.0 % 47.1 45.5 46.2  Platelets 150.0 - 400.0 K/uL 276.0 235.0 478    Basic Metabolic Panel:    Component Value Date/Time   NA 140 07/01/2015 1345   K 4.1 07/01/2015 1345   CL 103 07/01/2015 1345   CO2 31 07/01/2015 1345   BUN 12 07/01/2015 1345   CREATININE 1.09 07/01/2015 1345   CREATININE 0.96 07/02/2013 1739   GLUCOSE 102* 07/01/2015 1345   CALCIUM 9.5 07/01/2015 1345   Hepatic Function Latest Ref Rng 07/01/2015 06/30/2014 07/02/2013  Total Protein 6.0 - 8.3 g/dL 6.8 6.2 6.5  Albumin 3.5 - 5.2 g/dL 4.3 3.8 4.3  AST 0 - 37 U/L _1 ALT 0 - 53 U/L _2 Alk  Phosphatase 39 - 117 U/L 65 54 52  Total Bilirubin 0.2 - 1.2 mg/dL 0.6 0.9 0.9  Bilirubin, Direct 0.0 - 0.3 mg/dL 0.1 0.1 0.2    Lab Results  Component Value Date   TSH 2.61 03/30/2010   Lab Results  Component Value Date   PSA 1.02 07/01/2015   PSA 0.90 06/30/2014   PSA 1.07 07/02/2013    Assessment and Plan:   Healthcare maintenance  Encounter for screening colonoscopy - Plan: Ambulatory referral to Gastroenterology  Health Maintenance Exam: The patient's preventative maintenance and recommended screening tests for an annual wellness exam were reviewed in full today. Brought up to date unless services declined.  Counselled on the importance of diet, exercise, and its role in overall health and mortality. The patient's FH and SH was reviewed, including their home life, tobacco status, and drug and alcohol status.  Follow-up: No Follow-up on file. Unless noted, follow-up in 1 year for Health Maintenance Exam.  New Prescriptions   ASPIRIN EC 81 MG TABLET    Take 1 tablet (81 mg total) by mouth daily.   CHOLECALCIFEROL (  VITAMIN D) 2000 UNITS TABLET    Take 1 tablet (2,000 Units total) by mouth daily.   CLOTRIMAZOLE (LOTRIMIN) 1 % CREAM    Apply 1 application topically 2 (two) times daily.   FLUTICASONE (CUTIVATE) 0.005 % OINTMENT    Apply 1 application topically 2 (two) times daily.   TRAMADOL (ULTRAM) 50 MG TABLET    Take 1 tab prior to intercourse   VITAMIN B-12 (CYANOCOBALAMIN) 1000 MCG TABLET    Take 1 tablet (1,000 mcg total) by mouth daily.   Orders Placed This Encounter  Procedures  . Ambulatory referral to Gastroenterology    Signed,  Frederico Hamman T. Sharma Lawrance, MD   Patient's Medications  New Prescriptions   ASPIRIN EC 81 MG TABLET    Take 1 tablet (81 mg total) by mouth daily.   CHOLECALCIFEROL (VITAMIN D) 2000 UNITS TABLET    Take 1 tablet (2,000 Units total) by mouth daily.   CLOTRIMAZOLE (LOTRIMIN) 1 % CREAM    Apply 1 application topically 2 (two) times daily.     FLUTICASONE (CUTIVATE) 0.005 % OINTMENT    Apply 1 application topically 2 (two) times daily.   TRAMADOL (ULTRAM) 50 MG TABLET    Take 1 tab prior to intercourse   VITAMIN B-12 (CYANOCOBALAMIN) 1000 MCG TABLET    Take 1 tablet (1,000 mcg total) by mouth daily.  Previous Medications   B COMPLEX VITAMINS TABLET    Take 1 tablet by mouth daily.   DEXILANT 60 MG CAPSULE    Take 60 mg by mouth daily.    HYDROCORTISONE 2.5 % OINTMENT    Apply topically 2 (two) times daily.   METHOCARBAMOL (ROBAXIN) 500 MG TABLET    Take 1 tablet (500 mg total) by mouth 3 (three) times daily.   MULTIPLE VITAMIN (MULTIVITAMIN) TABLET    Take 1 tablet by mouth daily.   RANITIDINE (ZANTAC) 150 MG TABLET    Take 150 mg by mouth once.  Modified Medications   No medications on file  Discontinued Medications   AMOXICILLIN (AMOXIL) 500 MG CAPSULE    Take 2 capsules (1,000 mg total) by mouth 2 (two) times daily.   CHOLECALCIFEROL (VITAMIN D PO)    Take by mouth. As directed   DICLOFENAC (VOLTAREN) 75 MG EC TABLET    Take 1 tablet (75 mg total) by mouth 2 (two) times daily.

## 2015-09-11 LAB — HM COLONOSCOPY

## 2015-10-14 ENCOUNTER — Encounter: Payer: Self-pay | Admitting: Family Medicine

## 2015-10-14 ENCOUNTER — Ambulatory Visit (INDEPENDENT_AMBULATORY_CARE_PROVIDER_SITE_OTHER): Payer: BLUE CROSS/BLUE SHIELD | Admitting: Family Medicine

## 2015-10-14 VITALS — BP 106/70 | HR 71 | Temp 98.4°F | Ht 65.0 in | Wt 181.2 lb

## 2015-10-14 DIAGNOSIS — M129 Arthropathy, unspecified: Secondary | ICD-10-CM

## 2015-10-14 DIAGNOSIS — L299 Pruritus, unspecified: Secondary | ICD-10-CM | POA: Diagnosis not present

## 2015-10-14 DIAGNOSIS — K648 Other hemorrhoids: Secondary | ICD-10-CM | POA: Diagnosis not present

## 2015-10-14 DIAGNOSIS — M19049 Primary osteoarthritis, unspecified hand: Secondary | ICD-10-CM

## 2015-10-14 MED ORDER — HYDROCORTISONE ACE-PRAMOXINE 1-1 % RE CREA: 1.0000 "application " | TOPICAL_CREAM | Freq: Two times a day (BID) | RECTAL | Status: DC

## 2015-10-14 NOTE — Progress Notes (Signed)
Pre visit review using our clinic review tool, if applicable. No additional management support is needed unless otherwise documented below in the visit note. 

## 2015-10-14 NOTE — Progress Notes (Signed)
Dr. Frederico Hamman T. Arias Weinert, MD, [facility] [place], [address] Phone: [phone] Fax: [phone]  10/14/2015  Patient: Roger Ferguson, MRN: [id number], DOB: 04-09-1966, 50 y.o.  Primary Physician:  Owens Loffler, MD   Chief Complaint  Patient presents with  . Pain in Joints  . Itching all over   Subjective:   Roger Ferguson is a 50 y.o. very pleasant male patient who presents with the following:  Pain and swelling in his DIP and BIP joints. Has been ongoing for a long time. He has had some pain in his DIP joints, but he has not really had any swelling in his MCP or PIP joints at all. Is relatively minor, and is worse in the first digit. He has not had any trauma or accident. New  Also has had some occasional itching over the last week.  Int hem, noted internal hemorrhoids, recently defined on colonoscopy, and the patient has a prior colorectal surgery done prior to moving into this region. He wanted some advice in discussion about what to do regarding his hemorrhoids.  Past Medical History, Surgical History, Social History, Family History, Problem List, Medications, and Allergies have been reviewed and updated if relevant.  Patient Active Problem List   Diagnosis Date Noted  . GERD (gastroesophageal reflux disease) 02/13/2013  . 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 Patellofemoral syndrome 12/30/2010  . UNSPECIFIED VITAMIN B DEFICIENCY 03/30/2010  . UNSPECIFIED VITAMIN D DEFICIENCY 03/30/2010  . Backache 10/26/2009  . ANAL FISSURE 01/15/2009    Past Medical History  Diagnosis Date  . Anal itching     chronic  . History of hematuria     cystoscopy neg, rads eval  . 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 GERD (gastroesophageal reflux disease)     Past Surgical History  Procedure Laterality Date  . Rectal botox injection  09-2008  . Laparoscopic cholecystectomy      Social History   Social History  .  Marital Status: Married    Spouse Name: N/A  . Number of Children: 2  . Years of Education: 12+   Occupational History  . Psychiatric nurse.  .     Social History Main Topics  . Smoking status: Former Research scientist (life sciences)  . Smokeless tobacco: Never Used  . Alcohol Use: 0.0 oz/week    0 Standard drinks or equivalent per week  . Drug Use: No  . Sexual Activity:    Partners: Female   Other Topics Concern  . Not on file   Social History Narrative   Regular exercise---no      Originally from [place]   Married with 2 children             No family history on file.  No Known Allergies  Medication list reviewed and updated in full in [place].  GEN: No fevers, chills. Nontoxic. Primarily MSK c/o today. MSK: Detailed in the HPI GI: tolerating PO intake without difficulty Neuro: No numbness, parasthesias, or tingling associated. Otherwise the pertinent positives of the ROS are noted above.   Objective:   BP 106/70 mmHg  Pulse 71  Temp(Src) 98.4 F (36.9 C) (Oral)  Ht 5\' 5"  (1.651 m)  Wt 181 lb 4 oz (82.214 kg)  BMI 30.16 kg/m2   GEN: WDWN, NAD, Non-toxic, A & O x 3 HEENT: Atraumatic, Normocephalic. Neck supple. No masses, No LAD. Ears and Nose: No external deformity. CV: RRR,  No M/G/R. No JVD. No thrill. No extra heart sounds. PULM: CTA B, no wheezes, crackles, rhonchi. No retractions. No resp. distress. No accessory muscle use. EXTR: No c/c/e NEURO Normal gait.  PSYCH: Normally interactive. Conversant. Not depressed or anxious appearing.  Calm demeanor.    Radiology: No results found.  Assessment and Plan:   Internal hemorrhoid  Hand arthritis  Itching  Reassured about hand arthritis, I wouldn't do anything other than when necessary NSAIDs or Tylenol.  Reassured about itching, over-the-counter trial of antihistamines would be reasonable.  With regards to his internal hemorrhoid, I'm not sure that I would do anything with this  ear for at least from a surgical standpoint. Thing that he can continue to use some steroids on his as-needed basis when this flares up. At any point I'm happy to set him up with colorectal surgery.  Follow-up: No Follow-up on file.  New Prescriptions   PRAMOXINE-HYDROCORTISONE (PROCTOCREAM-HC) 1-1 % RECTAL CREAM    Place 1 application rectally 2 (two) times daily.   Signed,  Maud Deed. Rollins Wrightson, MD   Patient's Medications  New Prescriptions   PRAMOXINE-HYDROCORTISONE (PROCTOCREAM-HC) 1-1 % RECTAL CREAM    Place 1 application rectally 2 (two) times daily.  Previous Medications   ASPIRIN EC 81 MG TABLET    Take 1 tablet (81 mg total) by mouth daily.   B COMPLEX VITAMINS TABLET    Take 1 tablet by mouth daily.   CHOLECALCIFEROL (VITAMIN D) 2000 UNITS TABLET    Take 1 tablet (2,000 Units total) by mouth daily.   CLOTRIMAZOLE (LOTRIMIN) 1 % CREAM    Apply 1 application topically 2 (two) times daily.   DEXILANT 60 MG CAPSULE    Take 60 mg by mouth daily.    FLUTICASONE (CUTIVATE) 0.005 % OINTMENT    Apply 1 application topically 2 (two) times daily.   HYDROCORTISONE 2.5 % OINTMENT    Apply topically 2 (two) times daily.   METHOCARBAMOL (ROBAXIN) 500 MG TABLET    Take 1 tablet (500 mg total) by mouth 3 (three) times daily.   MULTIPLE VITAMIN (MULTIVITAMIN) TABLET    Take 1 tablet by mouth daily.   RANITIDINE (ZANTAC) 150 MG TABLET    Take 150 mg by mouth once.   TRAMADOL (ULTRAM) 50 MG TABLET    Take 1 tab prior to intercourse   VITAMIN B-12 (CYANOCOBALAMIN) 1000 MCG TABLET    Take 1 tablet (1,000 mcg total) by mouth daily.  Modified Medications   No medications on file  Discontinued Medications   No medications on file

## 2015-10-22 DIAGNOSIS — L300 Nummular dermatitis: Secondary | ICD-10-CM | POA: Diagnosis not present

## 2015-10-22 DIAGNOSIS — L218 Other seborrheic dermatitis: Secondary | ICD-10-CM | POA: Diagnosis not present

## 2015-12-10 ENCOUNTER — Ambulatory Visit (INDEPENDENT_AMBULATORY_CARE_PROVIDER_SITE_OTHER): Payer: BLUE CROSS/BLUE SHIELD | Admitting: Family Medicine

## 2015-12-10 ENCOUNTER — Encounter: Payer: Self-pay | Admitting: Family Medicine

## 2015-12-10 VITALS — BP 100/70 | HR 71 | Temp 98.4°F | Ht 65.0 in | Wt 180.2 lb

## 2015-12-10 DIAGNOSIS — H6980 Other specified disorders of Eustachian tube, unspecified ear: Secondary | ICD-10-CM | POA: Diagnosis not present

## 2015-12-10 MED ORDER — PSEUDOEPHEDRINE-GUAIFENESIN ER 60-600 MG PO TB12: 1.0000 | ORAL_TABLET | Freq: Every day | ORAL | Status: DC

## 2015-12-10 MED ORDER — FLUTICASONE PROPIONATE 50 MCG/ACT NA SUSP: 2.0000 | Freq: Every day | NASAL | Status: DC

## 2015-12-10 NOTE — Progress Notes (Signed)
Pre visit review using our clinic review tool, if applicable. No additional management support is needed unless otherwise documented below in the visit note. 

## 2015-12-10 NOTE — Progress Notes (Signed)
   Subjective:    Patient ID: Roger Ferguson, male    DOB: Dec 25, 1965, 50 y.o.   MRN: [id number]  Ear Fullness  There is pain in the left (pressure in left ear, feels fluid in ear canal) ear. This is a new problem. The current episode started more than 1 month ago. The problem occurs constantly. The problem has been waxing and waning. There has been no fever. The pain is at a severity of 1/10. Pertinent negatives include no coughing, ear discharge, rash or sore throat. Associated symptoms comments: No sneeze, no allergy symptoms.  Occ pain in left upper gums with chewing, has follow up with dentist.. He has tried NSAIDs for the symptoms. The treatment provided mild relief. There is no history of a chronic ear infection, hearing loss or a tympanostomy tube.   Sinus X-ray at dentist: build up in sinuses  Social History /Family History/Past Medical History reviewed and updated if needed.   Review of Systems  HENT: Negative for ear discharge and sore throat.   Respiratory: Negative for cough.   Skin: Negative for rash.       Objective:   Physical Exam  Constitutional: Vital signs are normal. He appears well-developed and well-nourished.  HENT:  Head: Normocephalic.  Right Ear: Hearing normal.  Left Ear: Hearing normal.  Nose: Nose normal.  Mouth/Throat: Oropharynx is clear and moist and mucous membranes are normal.  Neck: Trachea normal. Carotid bruit is not present. No thyroid mass and no thyromegaly present.  Cardiovascular: Normal rate, regular rhythm and normal pulses.  Exam reveals no gallop, no distant heart sounds and no friction rub.   No murmur heard. No peripheral edema  Pulmonary/Chest: Effort normal and breath sounds normal. No respiratory distress.  Skin: Skin is warm, dry and intact. No rash noted.  Psychiatric: He has a normal mood and affect. His speech is normal and behavior is normal. Thought content normal.          Assessment & Plan:

## 2015-12-10 NOTE — Patient Instructions (Signed)
Start flonase 2 sprays per nostril daily. Mucinex D daily for congestion. Call if not improving in next 2 weeks.

## 2015-12-24 DIAGNOSIS — H6980 Other specified disorders of Eustachian tube, unspecified ear: Secondary | ICD-10-CM | POA: Insufficient documentation

## 2015-12-24 NOTE — Assessment & Plan Note (Signed)
Start flonase 2 sprays per nostril daily. Mucinex D daily for congestion. Call if not improving in next 2 weeks.

## 2016-01-13 ENCOUNTER — Ambulatory Visit (INDEPENDENT_AMBULATORY_CARE_PROVIDER_SITE_OTHER): Payer: BLUE CROSS/BLUE SHIELD | Admitting: Family Medicine

## 2016-01-13 ENCOUNTER — Telehealth: Payer: Self-pay | Admitting: Family Medicine

## 2016-01-13 ENCOUNTER — Encounter: Payer: Self-pay | Admitting: Family Medicine

## 2016-01-13 VITALS — BP 106/80 | HR 69 | Temp 98.6°F | Ht 65.0 in | Wt 185.0 lb

## 2016-01-13 DIAGNOSIS — M79602 Pain in left arm: Secondary | ICD-10-CM

## 2016-01-13 DIAGNOSIS — H6980 Other specified disorders of Eustachian tube, unspecified ear: Secondary | ICD-10-CM | POA: Diagnosis not present

## 2016-01-13 DIAGNOSIS — R079 Chest pain, unspecified: Secondary | ICD-10-CM

## 2016-01-13 LAB — BRAIN NATRIURETIC PEPTIDE: Pro B Natriuretic peptide (BNP): 19 pg/mL (ref 0.0–100.0)

## 2016-01-13 LAB — TROPONIN I: TNIDX: 0 ug/l (ref 0.00–0.06)

## 2016-01-13 NOTE — Patient Instructions (Signed)

## 2016-01-13 NOTE — Telephone Encounter (Signed)
Pt returned your call.  

## 2016-01-13 NOTE — Progress Notes (Signed)
Dr. Frederico Hamman T. Naryiah Schley, MD, [facility] and [facility] Medicine [place], [address] Phone: [phone] Fax: [phone]  01/13/2016  Patient: Roger Ferguson, MRN: [id number], DOB: March 20, 1966, 50 y.o.  Primary Physician:  Owens Loffler, MD   Chief Complaint  Patient presents with  . Fluid in Ears    Left  . Chest Pain  . Fatigue  . Arm Pain    Left   Subjective:   Roger Ferguson is a 50 y.o. very pleasant male patient who presents with the following:  ETD Feels like some fluid in his ear.  Saw Dr. B, not improved on Flonase and Mucinex-D  Going on for about 15-20 days.  Sometimes will get L sided chest pain and it will come and go. . Aching. Several minutes at least.  Sometimes will last up to 20 minutes at a time.  Not sure if worse with exercise.  Generally feeling fatigued during this time, too.  F Hx CAD. Father, had some SOB and pain with incline. Had CAD, PCI. 60 years.  Remote tobacco Body mass index is 30.79 kg/(m^2).    Past Medical History, Surgical History, Social History, Family History, Problem List, Medications, and Allergies have been reviewed and updated if relevant.  Patient Active Problem List   Diagnosis Date Noted  . Eustachian tube dysfunction 12/24/2015  . GERD (gastroesophageal reflux disease) 02/13/2013  . 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 Patellofemoral syndrome 12/30/2010  . UNSPECIFIED VITAMIN B DEFICIENCY 03/30/2010  . UNSPECIFIED VITAMIN D DEFICIENCY 03/30/2010  . Backache 10/26/2009  . ANAL FISSURE 01/15/2009    Past Medical History  Diagnosis Date  . Anal itching     chronic  . History of hematuria     cystoscopy neg, rads eval  . 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 GERD (gastroesophageal reflux disease)     Past Surgical History  Procedure Laterality Date  . Rectal botox injection  09-2008  . Laparoscopic cholecystectomy      Social History   Social History  . Marital  Status: Married    Spouse Name: N/A  . Number of Children: 2  . Years of Education: 12+   Occupational History  . Psychiatric nurse.  .     Social History Main Topics  . Smoking status: Former Research scientist (life sciences)  . Smokeless tobacco: Never Used  . Alcohol Use: 0.0 oz/week    0 Standard drinks or equivalent per week  . Drug Use: No  . Sexual Activity:    Partners: Female   Other Topics Concern  . Not on file   Social History Narrative   Regular exercise---no      Originally from [place] ([place]   Married with 2 children             No family history on file.  No Known Allergies  Medication list reviewed and updated in full in [place].   GEN: No acute illnesses, no fevers, chills. GI: No n/v/d, eating normally Pulm: No SOB CP intermittently Interactive and getting along well at home.  Otherwise, ROS is as per the HPI.  Objective:   BP 106/80 mmHg  Pulse 69  Temp(Src) 98.6 F (37 C) (Oral)  Ht 5\' 5"  (1.651 m)  Wt 185 lb (83.915 kg)  BMI 30.79 kg/m2  GEN: WDWN, NAD, Non-toxic, A & O x 3 HEENT: Atraumatic, Normocephalic. Neck supple. No masses, No LAD. Ears and Nose: No external deformity.  CV: RRR, No M/G/R. No JVD. No thrill. No extra heart sounds. PULM: CTA B, no wheezes, crackles, rhonchi. No retractions. No resp. distress. No accessory muscle use. EXTR: No c/c/e NEURO Normal gait.  PSYCH: Normally interactive. Conversant. Not depressed or anxious appearing.  Calm demeanor.   Laboratory and Imaging Data: Results for orders placed or performed in visit on 01/13/16  Troponin I  Result Value Ref Range   TNIDX 0.00 0.00 - 0.06 ug/l  Brain natriuretic peptide  Result Value Ref Range   Pro B Natriuretic peptide (BNP) 19.0 0.0 - 100.0 pg/mL     Assessment and Plan:   Chest pain, unspecified chest pain type - Plan: EKG 12-Lead, Troponin I, Brain natriuretic peptide, Ambulatory referral to Cardiology  Left arm pain - Plan: Troponin  I, Brain natriuretic peptide, Ambulatory referral to Cardiology  Eustachian tube dysfunction, unspecified laterality  EKG: Normal sinus rhythm. Normal axis, normal R wave progression, No acute ST elevation or depression. IRBBB. Inverted T on lead III.  On my exam, does not appear the patient's, skeletal skeletal origin for his symptoms. He does have some dull chest pain on the left, he does have a family history in his father having coronary disease requiring interventions. Troponins are normal today. EKG is relatively reassuring, but not completely normal. Patient could certainly have underlying coronary disease. We have set him up to see cardiology next week for further evaluation. Dr. Einar Gip to assess.  BNP normal.  We also discussed BNP.  Follow-up: w/ Cardiology  Orders Placed This Encounter  Procedures  . Troponin I  . Brain natriuretic peptide  . Ambulatory referral to Cardiology  . EKG 12-Lead    Signed,  Ambreen Tufte T. Jaquann Guarisco, MD   Patient's Medications  New Prescriptions   No medications on file  Previous Medications   ASPIRIN EC 81 MG TABLET    Take 1 tablet (81 mg total) by mouth daily.   B COMPLEX VITAMINS TABLET    Take 1 tablet by mouth daily.   CHOLECALCIFEROL (VITAMIN D) 2000 UNITS TABLET    Take 1 tablet (2,000 Units total) by mouth daily.   CLOTRIMAZOLE (LOTRIMIN) 1 % CREAM    Apply 1 application topically 2 (two) times daily.   DEXILANT 60 MG CAPSULE    Take 60 mg by mouth daily.    FLUTICASONE (CUTIVATE) 0.005 % OINTMENT    Apply 1 application topically 2 (two) times daily.   FLUTICASONE (FLONASE) 50 MCG/ACT NASAL SPRAY    Place 2 sprays into both nostrils daily.   HYDROCORTISONE 2.5 % OINTMENT    Apply topically 2 (two) times daily.   METHOCARBAMOL (ROBAXIN) 500 MG TABLET    Take 1 tablet (500 mg total) by mouth 3 (three) times daily.   MULTIPLE VITAMIN (MULTIVITAMIN) TABLET    Take 1 tablet by mouth daily.   PRAMOXINE-HYDROCORTISONE (PROCTOCREAM-HC) 1-1 %  RECTAL CREAM    Place 1 application rectally 2 (two) times daily.   PSEUDOEPHEDRINE-GUAIFENESIN (MUCINEX D) 60-600 MG 12 HR TABLET    Take 1 tablet by mouth daily.   RANITIDINE (ZANTAC) 150 MG TABLET    Take 150 mg by mouth once.   TRAMADOL (ULTRAM) 50 MG TABLET    Take 1 tab prior to intercourse   VITAMIN B-12 (CYANOCOBALAMIN) 1000 MCG TABLET    Take 1 tablet (1,000 mcg total) by mouth daily.  Modified Medications   No medications on file  Discontinued Medications   No medications on file

## 2016-01-13 NOTE — Telephone Encounter (Signed)
See result note from 01/13/2016 labs.

## 2016-01-13 NOTE — Progress Notes (Signed)
Pre visit review using our clinic review tool, if applicable. No additional management support is needed unless otherwise documented below in the visit note. 

## 2016-01-21 ENCOUNTER — Other Ambulatory Visit: Payer: Self-pay | Admitting: Cardiology

## 2016-01-21 ENCOUNTER — Ambulatory Visit
Admission: RE | Admit: 2016-01-21 | Discharge: 2016-01-21 | Disposition: A | Discharge status: Home / Self Care | Payer: BLUE CROSS/BLUE SHIELD | Source: Ambulatory Visit | Attending: Cardiology | Admitting: Cardiology

## 2016-01-21 DIAGNOSIS — R079 Chest pain, unspecified: Secondary | ICD-10-CM | POA: Diagnosis not present

## 2016-01-21 DIAGNOSIS — Z8249 Family history of ischemic heart disease and other diseases of the circulatory system: Secondary | ICD-10-CM | POA: Diagnosis not present

## 2016-01-21 DIAGNOSIS — R0789 Other chest pain: Secondary | ICD-10-CM

## 2016-01-29 DIAGNOSIS — K219 Gastro-esophageal reflux disease without esophagitis: Secondary | ICD-10-CM | POA: Diagnosis not present

## 2016-01-29 DIAGNOSIS — R0789 Other chest pain: Secondary | ICD-10-CM | POA: Diagnosis not present

## 2016-02-12 ENCOUNTER — Encounter: Payer: Self-pay | Admitting: Family Medicine

## 2016-03-14 ENCOUNTER — Other Ambulatory Visit: Payer: Self-pay | Admitting: Family Medicine

## 2016-05-02 DIAGNOSIS — Z23 Encounter for immunization: Secondary | ICD-10-CM | POA: Diagnosis not present

## 2016-06-01 ENCOUNTER — Encounter: Payer: Self-pay | Admitting: Family Medicine

## 2016-06-01 ENCOUNTER — Ambulatory Visit (INDEPENDENT_AMBULATORY_CARE_PROVIDER_SITE_OTHER): Payer: BLUE CROSS/BLUE SHIELD | Admitting: Family Medicine

## 2016-06-01 VITALS — BP 108/70 | HR 68 | Temp 98.1°F | Wt 180.0 lb

## 2016-06-01 DIAGNOSIS — L309 Dermatitis, unspecified: Secondary | ICD-10-CM

## 2016-06-01 DIAGNOSIS — K648 Other hemorrhoids: Secondary | ICD-10-CM

## 2016-06-01 DIAGNOSIS — H6980 Other specified disorders of Eustachian tube, unspecified ear: Secondary | ICD-10-CM

## 2016-06-01 MED ORDER — FLUTICASONE PROPIONATE 0.005 % EX OINT: 1.0000 "application " | TOPICAL_OINTMENT | Freq: Two times a day (BID) | CUTANEOUS | 1 refills | Status: DC

## 2016-06-01 MED ORDER — OXYMETAZOLINE HCL 0.05 % NA SOLN: 1.0000 | Freq: Two times a day (BID) | NASAL | 0 refills | Status: DC

## 2016-06-01 MED ORDER — FLUTICASONE PROPIONATE 50 MCG/ACT NA SUSP: 2.0000 | Freq: Every day | NASAL | 6 refills | Status: DC

## 2016-06-01 MED ORDER — PSEUDOEPHEDRINE HCL 60 MG PO TABS: 60.0000 mg | ORAL_TABLET | ORAL | 0 refills | Status: DC | PRN

## 2016-06-01 MED ORDER — HYDROCORTISONE 2.5 % EX OINT: TOPICAL_OINTMENT | Freq: Two times a day (BID) | CUTANEOUS | 0 refills | Status: DC | PRN

## 2016-06-01 NOTE — Patient Instructions (Addendum)
Please use your nasal spray every night until after your air travel 30 minutes prior to take off and landing, take pseudoephedrine 30 mg (decongestant), can also use this 1-2 times a day as needed.    Eustachian Tube Dysfunction Introduction The eustachian tube connects the middle ear to the back of the nose. It regulates air pressure in the middle ear by allowing air to move between the ear and nose. It also helps to drain fluid from the middle ear space. When the eustachian tube does not function properly, air pressure, fluid, or both can build up in the middle ear. Eustachian tube dysfunction can affect one or both ears. What are the causes? This condition happens when the eustachian tube becomes blocked or cannot open normally. This may result from:  Ear infections.  Colds and other upper respiratory infections.  Allergies.  Irritation, such as from cigarette smoke or acid from the stomach coming up into the esophagus (gastroesophageal reflux).  Sudden changes in air pressure, such as from descending in an airplane.  Abnormal growths in the nose or throat, such as nasal polyps, tumors, or enlarged tissue at the back of the throat (adenoids). What increases the risk? This condition may be more likely to develop in people who smoke and people who are overweight. Eustachian tube dysfunction may also be more likely to develop in children, especially children who have:  Certain birth defects of the mouth, such as cleft palate.  Large tonsils and adenoids. What are the signs or symptoms? Symptoms of this condition may include:  A feeling of fullness in the ear.  Ear pain.  Clicking or popping noises in the ear.  Ringing in the ear.  Hearing loss.  Loss of balance. Symptoms may get worse when the air pressure around you changes, such as when you travel to an area of high elevation or fly on an airplane. How is this diagnosed? This condition may be diagnosed based on:  Your  symptoms.  A physical exam of your ear, nose, and throat.  Tests, such as those that measure:  The movement of your eardrum (tympanogram).  Your hearing (audiometry). How is this treated? Treatment depends on the cause and severity of your condition. If your symptoms are mild, you may be able to relieve your symptoms by moving air into ("popping") your ears. If you have symptoms of fluid in your ears, treatment may include:  Decongestants.  Antihistamines.  Nasal sprays or ear drops that contain medicines that reduce swelling (steroids). In some cases, you may need to have a procedure to drain the fluid in your eardrum (myringotomy). In this procedure, a small tube is placed in the eardrum to:  Drain the fluid.  Restore the air in the middle ear space. Follow these instructions at home:  Take over-the-counter and prescription medicines only as told by your health care provider.  Use techniques to help pop your ears as recommended by your health care provider. These may include:  Chewing gum.  Yawning.  Frequent, forceful swallowing.  Closing your mouth, holding your nose closed, and gently blowing as if you are trying to blow air out of your nose.  Do not do any of the following until your health care provider approves:  Travel to high altitudes.  Fly in airplanes.  Work in a Pension scheme manager or room.  Scuba dive.  Keep your ears dry. Dry your ears completely after showering or bathing.  Do not smoke.  Keep all follow-up visits as told  by your health care provider. This is important. Contact a health care provider if:  Your symptoms do not go away after treatment.  Your symptoms come back after treatment.  You are unable to pop your ears.  You have:  A fever.  Pain in your ear.  Pain in your head or neck.  Fluid draining from your ear.  Your hearing suddenly changes.  You become very dizzy.  You lose your balance. This information is not  intended to replace advice given to you by your health care provider. Make sure you discuss any questions you have with your health care provider. Document Released: 07/24/2015 Document Revised: 12/03/2015 Document Reviewed: 07/16/2014  2017 Elsevier

## 2016-06-01 NOTE — Progress Notes (Signed)
Subjective:    Patient ID: Roger Ferguson, male    DOB: October 17, 1965, 50 y.o.   MRN: [id number]  HPI This is a 50 yo male who presents today with left ear pain, feels like there is fluid build up for several days. Pressure worse in the morning and hurts with pressing on his ear. No runny nose, no sore throat, no cough, no fever. Was seen 6/17 with similar symptoms, was put on flonase and mucinex. Stopped when symptoms resolved. Has used flonase once since symptoms returned.   No recent air travel. Is planning on flying in a couple of weeks.   Requests refill of steroid cream for facial dermatitis and steroid cream he uses occasionally for hemorrhoids.   Past Medical History:  Diagnosis Date  . Anal itching    chronic  . GERD (gastroesophageal reflux disease)   . History of hematuria    cystoscopy neg, rads eval  . 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 LAPAROSCOPIC CHOLECYSTECTOMY    . RECTAL BOTOX INJECTION  09-2008   Family History  Problem Relation Age of Onset  . Coronary artery disease  48   Social History  Substance Use Topics  . Smoking status: Former Research scientist (life sciences)  . Smokeless tobacco: Never Used  . Alcohol use 0.0 oz/week      Review of Systems Per HPI    Objective:   Physical Exam  Constitutional: He is oriented to person, place, and time. He appears well-developed and well-nourished. No distress.  HENT:  Head: Normocephalic and atraumatic.  Right Ear: Tympanic membrane, external ear and ear canal normal.  Left Ear: External ear normal.  Nose: Nose normal.  Mouth/Throat: Oropharynx is clear and moist. No oropharyngeal exudate.  Right TM occluded with cerumen, irrigated with good results. No pain. Right TM dull.   Eyes: Conjunctivae are normal.  Neck: Normal range of motion. Neck supple.  Cardiovascular: Normal rate, regular rhythm and normal heart sounds.   Pulmonary/Chest: Effort normal and breath sounds normal.    Lymphadenopathy:    He has no cervical adenopathy.  Neurological: He is alert and oriented to person, place, and time.  Skin: Skin is warm and dry. He is not diaphoretic.  Psychiatric: He has a normal mood and affect. His behavior is normal. Judgment and thought content normal.  Vitals reviewed.     BP 108/70   Pulse 68   Temp 98.1 F (36.7 C)   Wt 180 lb (81.6 kg)   SpO2 98%   BMI 29.95 kg/m  Wt Readings from Last 3 Encounters:  06/01/16 180 lb (81.6 kg)  01/13/16 185 lb (83.9 kg)  12/10/15 180 lb 4 oz (81.8 kg)       Assessment & Plan:  1. Eustachian tube dysfunction, unspecified laterality - Provided written and verbal information regarding diagnosis and treatment. - fluticasone (FLONASE) 50 MCG/ACT nasal spray; Place 2 sprays into both nostrils daily.  Dispense: 16 g; Refill: 6 - pseudoephedrine (SUDAFED) 60 MG tablet; Take 1 tablet (60 mg total) by mouth every 4 (four) hours as needed for congestion.  Dispense: 30 tablet; Refill: 0 - oxymetazoline (AFRIN NASAL SPRAY) 0.05 % nasal spray; Place 1 spray into both nostrils 2 (two) times daily. Use before flonase for 4 days only. Then stop.  Dispense: 30 mL; Refill: 0 - advised to use decongestant prior to take off and landing when flying - RTC precautions reviewed  2. Internal hemorrhoid - fluticasone (CUTIVATE) 0.005 %  ointment; Apply 1 application topically 2 (two) times daily.  Dispense: 60 g; Refill: 1  3. Dermatitis of face - hydrocortisone 2.5 % ointment; Apply topically 2 (two) times daily as needed. No more than 7 days in a row.  Dispense: 40 g; Refill: 0  Clarene Reamer, FNP-BC  [facility]  06/01/2016 9:32 PM

## 2016-06-30 ENCOUNTER — Other Ambulatory Visit: Payer: Self-pay | Admitting: Family Medicine

## 2016-06-30 DIAGNOSIS — R5383 Other fatigue: Secondary | ICD-10-CM

## 2016-06-30 DIAGNOSIS — E559 Vitamin D deficiency, unspecified: Secondary | ICD-10-CM

## 2016-06-30 DIAGNOSIS — Z79899 Other long term (current) drug therapy: Secondary | ICD-10-CM

## 2016-06-30 DIAGNOSIS — E538 Deficiency of other specified B group vitamins: Secondary | ICD-10-CM

## 2016-06-30 DIAGNOSIS — E7849 Other hyperlipidemia: Secondary | ICD-10-CM

## 2016-06-30 DIAGNOSIS — Z125 Encounter for screening for malignant neoplasm of prostate: Secondary | ICD-10-CM

## 2016-07-01 ENCOUNTER — Other Ambulatory Visit (INDEPENDENT_AMBULATORY_CARE_PROVIDER_SITE_OTHER): Payer: BLUE CROSS/BLUE SHIELD

## 2016-07-01 DIAGNOSIS — E559 Vitamin D deficiency, unspecified: Secondary | ICD-10-CM

## 2016-07-01 DIAGNOSIS — E784 Other hyperlipidemia: Secondary | ICD-10-CM | POA: Diagnosis not present

## 2016-07-01 DIAGNOSIS — E7849 Other hyperlipidemia: Secondary | ICD-10-CM

## 2016-07-01 DIAGNOSIS — Z125 Encounter for screening for malignant neoplasm of prostate: Secondary | ICD-10-CM | POA: Diagnosis not present

## 2016-07-01 DIAGNOSIS — R5383 Other fatigue: Secondary | ICD-10-CM | POA: Diagnosis not present

## 2016-07-01 DIAGNOSIS — E538 Deficiency of other specified B group vitamins: Secondary | ICD-10-CM | POA: Diagnosis not present

## 2016-07-01 DIAGNOSIS — Z79899 Other long term (current) drug therapy: Secondary | ICD-10-CM

## 2016-07-01 LAB — BASIC METABOLIC PANEL
BUN: 10 mg/dL (ref 6–23)
CALCIUM: 9.4 mg/dL (ref 8.4–10.5)
CO2: 33 mEq/L — ABNORMAL HIGH (ref 19–32)
Chloride: 103 mEq/L (ref 96–112)
Creatinine, Ser: 1.01 mg/dL (ref 0.40–1.50)
GFR: 82.82 mL/min (ref >60.00)
GLUCOSE: 99 mg/dL (ref 70–99)
POTASSIUM: 4 meq/L (ref 3.5–5.1)
SODIUM: 141 meq/L (ref 135–145)

## 2016-07-01 LAB — VITAMIN D 25 HYDROXY (VIT D DEFICIENCY, FRACTURES): VITD: 23.88 ng/mL — ABNORMAL LOW (ref 30.00–100.00)

## 2016-07-01 LAB — CBC WITH DIFFERENTIAL/PLATELET
BASOS ABS: 0 10*3/uL (ref 0.0–0.1)
Basophils Relative: 0.6 % (ref 0.0–3.0)
EOS PCT: 2.9 % (ref 0.0–5.0)
Eosinophils Absolute: 0.2 10*3/uL (ref 0.0–0.7)
HEMATOCRIT: 45.2 % (ref 39.0–52.0)
HEMOGLOBIN: 15.2 g/dL (ref 13.0–17.0)
LYMPHS PCT: 35.9 % (ref 12.0–46.0)
Lymphs Abs: 2.6 10*3/uL (ref 0.7–4.0)
MCHC: 33.7 g/dL (ref 30.0–36.0)
MCV: 84 fl (ref 78.0–100.0)
MONOS PCT: 8.4 % (ref 3.0–12.0)
Monocytes Absolute: 0.6 10*3/uL (ref 0.1–1.0)
NEUTROS PCT: 52.2 % (ref 43.0–77.0)
Neutro Abs: 3.8 10*3/uL (ref 1.4–7.7)
Platelets: 275 10*3/uL (ref 150.0–400.0)
RBC: 5.39 Mil/uL (ref 4.22–5.81)
RDW: 13.4 % (ref 11.5–15.5)
WBC: 7.2 10*3/uL (ref 4.0–10.5)

## 2016-07-01 LAB — LIPID PANEL
CHOLESTEROL: 140 mg/dL (ref 0–200)
HDL: 38.5 mg/dL — AB (ref >39.00)
LDL CALC: 86 mg/dL (ref 0–99)
NonHDL: 101.72
Total CHOL/HDL Ratio: 4
Triglycerides: 79 mg/dL (ref 0.0–149.0)
VLDL: 15.8 mg/dL (ref 0.0–40.0)

## 2016-07-01 LAB — HEPATIC FUNCTION PANEL
ALBUMIN: 4.3 g/dL (ref 3.5–5.2)
ALT: 8 U/L (ref 0–53)
AST: 14 U/L (ref 0–37)
Alkaline Phosphatase: 59 U/L (ref 39–117)
BILIRUBIN TOTAL: 0.7 mg/dL (ref 0.2–1.2)
Bilirubin, Direct: 0.1 mg/dL (ref 0.0–0.3)
TOTAL PROTEIN: 6.9 g/dL (ref 6.0–8.3)

## 2016-07-01 LAB — TSH: TSH: 2.34 u[IU]/mL (ref 0.35–4.50)

## 2016-07-01 LAB — VITAMIN B12: Vitamin B-12: 139 pg/mL — ABNORMAL LOW (ref 211–911)

## 2016-07-01 LAB — PSA: PSA: 1.27 ng/mL (ref 0.10–4.00)

## 2016-07-18 ENCOUNTER — Ambulatory Visit (INDEPENDENT_AMBULATORY_CARE_PROVIDER_SITE_OTHER): Payer: BLUE CROSS/BLUE SHIELD | Admitting: Family Medicine

## 2016-07-18 ENCOUNTER — Encounter: Payer: Self-pay | Admitting: Family Medicine

## 2016-07-18 VITALS — BP 120/78 | HR 71 | Temp 99.0°F | Ht 65.75 in | Wt 174.5 lb

## 2016-07-18 DIAGNOSIS — Z0000 Encounter for general adult medical examination without abnormal findings: Secondary | ICD-10-CM | POA: Diagnosis not present

## 2016-07-18 MED ORDER — VITAMIN D 50 MCG (2000 UT) PO TABS: 2000.0000 [IU] | ORAL_TABLET | Freq: Every day | ORAL | 3 refills | Status: DC

## 2016-07-18 MED ORDER — VITAMIN B-12 1000 MCG PO TABS: 2000.0000 ug | ORAL_TABLET | Freq: Every day | ORAL | 3 refills | Status: DC

## 2016-07-18 NOTE — Progress Notes (Signed)
Pre visit review using our clinic review tool, if applicable. No additional management support is needed unless otherwise documented below in the visit note. 

## 2016-07-18 NOTE — Progress Notes (Signed)
Dr. Frederico Hamman T. Roshawn Lacina, MD, [facility] [place], [address] Phone: [phone] Fax: [phone]  07/18/2016  Patient: Roger Ferguson, MRN: [id number], DOB: [birth year], 51 y.o.  Primary Physician:  Owens Loffler, MD   Chief Complaint  Patient presents with  . Annual Exam   Subjective:   Roger Ferguson is a 51 y.o. pleasant patient who presents with the following:  Preventative Health Maintenance Visit:  Health Maintenance Summary Reviewed and updated, unless pt declines services.  Tobacco History Reviewed. Alcohol: No concerns, no excessive use Exercise Habits: walking dog, rec at least 30 mins 5 times a week STD concerns: no risk or activity to increase risk Drug Use: None Encouraged self-testicular check  Lost 11 pounds  Wt Readings from Last 3 Encounters:  07/18/16 174 lb 8 oz (79.2 kg)  06/01/16 180 lb (81.6 kg)  01/13/16 185 lb (83.9 kg)    Health Maintenance  Topic Date Due  . HIV Screening  08/27/1980  . COLONOSCOPY  07/11/2017  . TETANUS/TDAP  06/07/2020  . INFLUENZA VACCINE  Completed   Immunization History  Administered Date(s) Administered  . Influenza Split 03/25/2012  . Influenza Whole 04/24/2010  . Influenza,inj,Quad PF,36+ Mos 04/24/2014, 05/06/2015, 05/02/2016  . Influenza-Unspecified 03/25/2013  . Td 06/07/2010   Patient Active Problem List   Diagnosis Date Noted  . GERD (gastroesophageal reflux disease) 02/13/2013  . 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 UNSPECIFIED VITAMIN B DEFICIENCY 03/30/2010  . UNSPECIFIED VITAMIN D DEFICIENCY 03/30/2010  . Backache 10/26/2009  . ANAL FISSURE 01/15/2009   Past Medical History:  Diagnosis Date  . Anal itching    chronic  . GERD (gastroesophageal reflux disease)   . History of hematuria    cystoscopy neg, rads eval  . 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 LAPAROSCOPIC  CHOLECYSTECTOMY    . RECTAL BOTOX INJECTION  09-2008   Social History   Social History  . Marital status: Married    Spouse name: N/A  . Number of children: 2  . Years of education: 12+   Occupational History  . Psychiatric nurse.  .  [employer]   Social History Main Topics  . Smoking status: Former Research scientist (life sciences)  . Smokeless tobacco: Never Used  . Alcohol use 0.0 oz/week  . Drug use: No  . Sexual activity: Yes    Partners: Female   Other Topics Concern  . Not on file   Social History Narrative   Regular exercise---no      Originally from [place]   Married with 2 children            Family History  Problem Relation Age of Onset  . Coronary artery disease  60   No Known Allergies  Medication list has been reviewed and updated.   General: Denies fever, chills, sweats. No significant weight loss. Eyes: Denies blurring,significant itching ENT: Denies earache, sore throat, and hoarseness. Cardiovascular: Denies chest pains, palpitations, dyspnea on exertion Respiratory: Denies cough, dyspnea at rest,wheeezing Breast: no concerns about lumps GI: Denies nausea, vomiting, diarrhea, constipation, change in bowel habits, abdominal pain, melena, hematochezia GU: Denies penile discharge, ED, urinary flow / outflow problems. No STD concerns. Musculoskeletal: Denies back pain, joint pain Derm: Denies rash, itching Neuro: Denies  paresthesias, frequent falls, frequent headaches Psych: Denies depression, anxiety Endocrine: Denies cold intolerance, heat intolerance, polydipsia Heme: Denies enlarged lymph nodes Allergy:  No hayfever  Objective:   BP 120/78   Pulse 71   Temp 99 F (37.2 C) (Oral)   Ht 5' 5.75" (1.67 m)   Wt 174 lb 8 oz (79.2 kg)   BMI 28.38 kg/m  Ideal Body Weight: Weight in (lb) to have BMI = 25: 153.4  No exam data present  GEN: well developed, well nourished, no acute distress Eyes: conjunctiva and lids normal, PERRLA,  EOMI ENT: TM clear, nares clear, oral exam WNL Neck: supple, no lymphadenopathy, no thyromegaly, no JVD Pulm: clear to auscultation and percussion, respiratory effort normal CV: regular rate and rhythm, S1-S2, no murmur, rub or gallop, no bruits, peripheral pulses normal and symmetric, no cyanosis, clubbing, edema or varicosities GI: soft, non-tender; no hepatosplenomegaly, masses; active bowel sounds all quadrants GU: no hernia, testicular mass, penile discharge Lymph: no cervical, axillary or inguinal adenopathy MSK: gait normal, muscle tone and strength WNL, no joint swelling, effusions, discoloration, crepitus  SKIN: clear, good turgor, color WNL, no rashes, lesions, or ulcerations Neuro: normal mental status, normal strength, sensation, and motion Psych: alert; oriented to person, place and time, normally interactive and not anxious or depressed in appearance. All labs reviewed with patient.  Lipids:    Component Value Date/Time   CHOL 140 07/01/2016 0821   TRIG 79.0 07/01/2016 0821   HDL 38.50 (L) 07/01/2016 0821   VLDL 15.8 07/01/2016 0821   CHOLHDL 4 07/01/2016 0821   CBC: CBC Latest Ref Rng & Units 07/01/2016 07/01/2015 06/30/2014  WBC 4.0 - 10.5 K/uL 7.2 7.4 7.1  Hemoglobin 13.0 - 17.0 g/dL 15.2 15.3 14.7  Hematocrit 39.0 - 52.0 % 45.2 47.1 45.5  Platelets 150.0 - 400.0 K/uL 275.0 276.0 235.0    Basic Metabolic Panel:    Component Value Date/Time   NA 141 07/01/2016 0821   K 4.0 07/01/2016 0821   CL 103 07/01/2016 0821   CO2 33 (H) 07/01/2016 0821   BUN 10 07/01/2016 0821   CREATININE 1.01 07/01/2016 0821   CREATININE 0.96 07/02/2013 1739   GLUCOSE 99 07/01/2016 0821   CALCIUM 9.4 07/01/2016 0821   Hepatic Function Latest Ref Rng & Units 07/01/2016 07/01/2015 06/30/2014  Total Protein 6.0 - 8.3 g/dL 6.9 6.8 6.2  Albumin 3.5 - 5.2 g/dL 4.3 4.3 3.8  AST 0 - 37 U/L 14 18 16  ALT 0 - 53 U/L 8 10 7  Alk Phosphatase 39 - 117 U/L 59 65 54  Total Bilirubin 0.2 -  1.2 mg/dL 0.7 0.6 0.9  Bilirubin, Direct 0.0 - 0.3 mg/dL 0.1 0.1 0.1    Lab Results  Component Value Date   TSH 2.34 07/01/2016   Lab Results  Component Value Date   PSA 1.27 07/01/2016   PSA 1.02 07/01/2015   PSA 0.90 06/30/2014    Assessment and Plan:   Healthcare maintenance  Health Maintenance Exam: The patient's preventative maintenance and recommended screening tests for an annual wellness exam were reviewed in full today. Brought up to date unless services declined.  Counselled on the importance of diet, exercise, and its role in overall health and mortality. The patient's FH and SH was reviewed, including their home life, tobacco status, and drug and alcohol status.  Follow-up in 1 year for physical exam or additional follow-up below.  Restart B12 and D  Follow-up: No Follow-up on file. Or follow-up in 1 year if not noted.  Meds ordered this encounter  Medications  . vitamin B-12 (CYANOCOBALAMIN) 1000 MCG tablet      Sig: Take 2 tablets (2,000 mcg total) by mouth daily.    Dispense:  180 tablet    Refill:  3  . Cholecalciferol (VITAMIN D) 2000 units tablet    Sig: Take 1 tablet (2,000 Units total) by mouth daily.    Dispense:  90 tablet    Refill:  3   Medications Discontinued During This Encounter  Medication Reason  . vitamin B-12 (CYANOCOBALAMIN) 1000 MCG tablet Completed Course  . traMADol (ULTRAM) 50 MG tablet Completed Course  . pseudoephedrine-guaifenesin (MUCINEX D) 60-600 MG 12 hr tablet Completed Course  . pseudoephedrine (SUDAFED) 60 MG tablet Completed Course  . oxymetazoline (AFRIN NASAL SPRAY) 0.05 % nasal spray Completed Course  . Multiple Vitamin (MULTIVITAMIN) tablet Completed Course  . methocarbamol (ROBAXIN) 500 MG tablet Completed Course  . fluticasone (FLONASE) 50 MCG/ACT nasal spray Completed Course  . Cholecalciferol (VITAMIN D) 2000 units tablet Completed Course  . b complex vitamins tablet Completed Course  . aspirin EC 81 MG  tablet Completed Course   No orders of the defined types were placed in this encounter.   Signed,  Spencer T. Copland, MD   Allergies as of 07/18/2016   No Known Allergies     Medication List       Accurate as of 07/18/16  3:33 PM. Always use your most recent med list.          clotrimazole 1 % cream Commonly known as:  LOTRIMIN Apply 1 application topically 2 (two) times daily.   DEXILANT 60 MG capsule Generic drug:  dexlansoprazole Take 60 mg by mouth daily.   fluticasone 0.005 % ointment Commonly known as:  CUTIVATE Apply 1 application topically 2 (two) times daily.   hydrocortisone 2.5 % ointment Apply topically 2 (two) times daily as needed. No more than 7 days in a row.   pramoxine-hydrocortisone 1-1 % rectal cream Commonly known as:  PROCTOCREAM-HC Place 1 application rectally 2 (two) times daily.   ranitidine 150 MG tablet Commonly known as:  ZANTAC Take 150 mg by mouth once.   vitamin B-12 1000 MCG tablet Commonly known as:  CYANOCOBALAMIN Take 2 tablets (2,000 mcg total) by mouth daily.   Vitamin D 2000 units tablet Take 1 tablet (2,000 Units total) by mouth daily.       

## 2016-08-12 IMAGING — CR DG CHEST 2V
2 series · 2 of 2 positions shown · non-contrast
Comparison: None in PACs

CLINICAL DATA: Left-sided chest pain for the past several days;
former smoker.

EXAM:
CHEST  2 VIEW

[w chest pa]
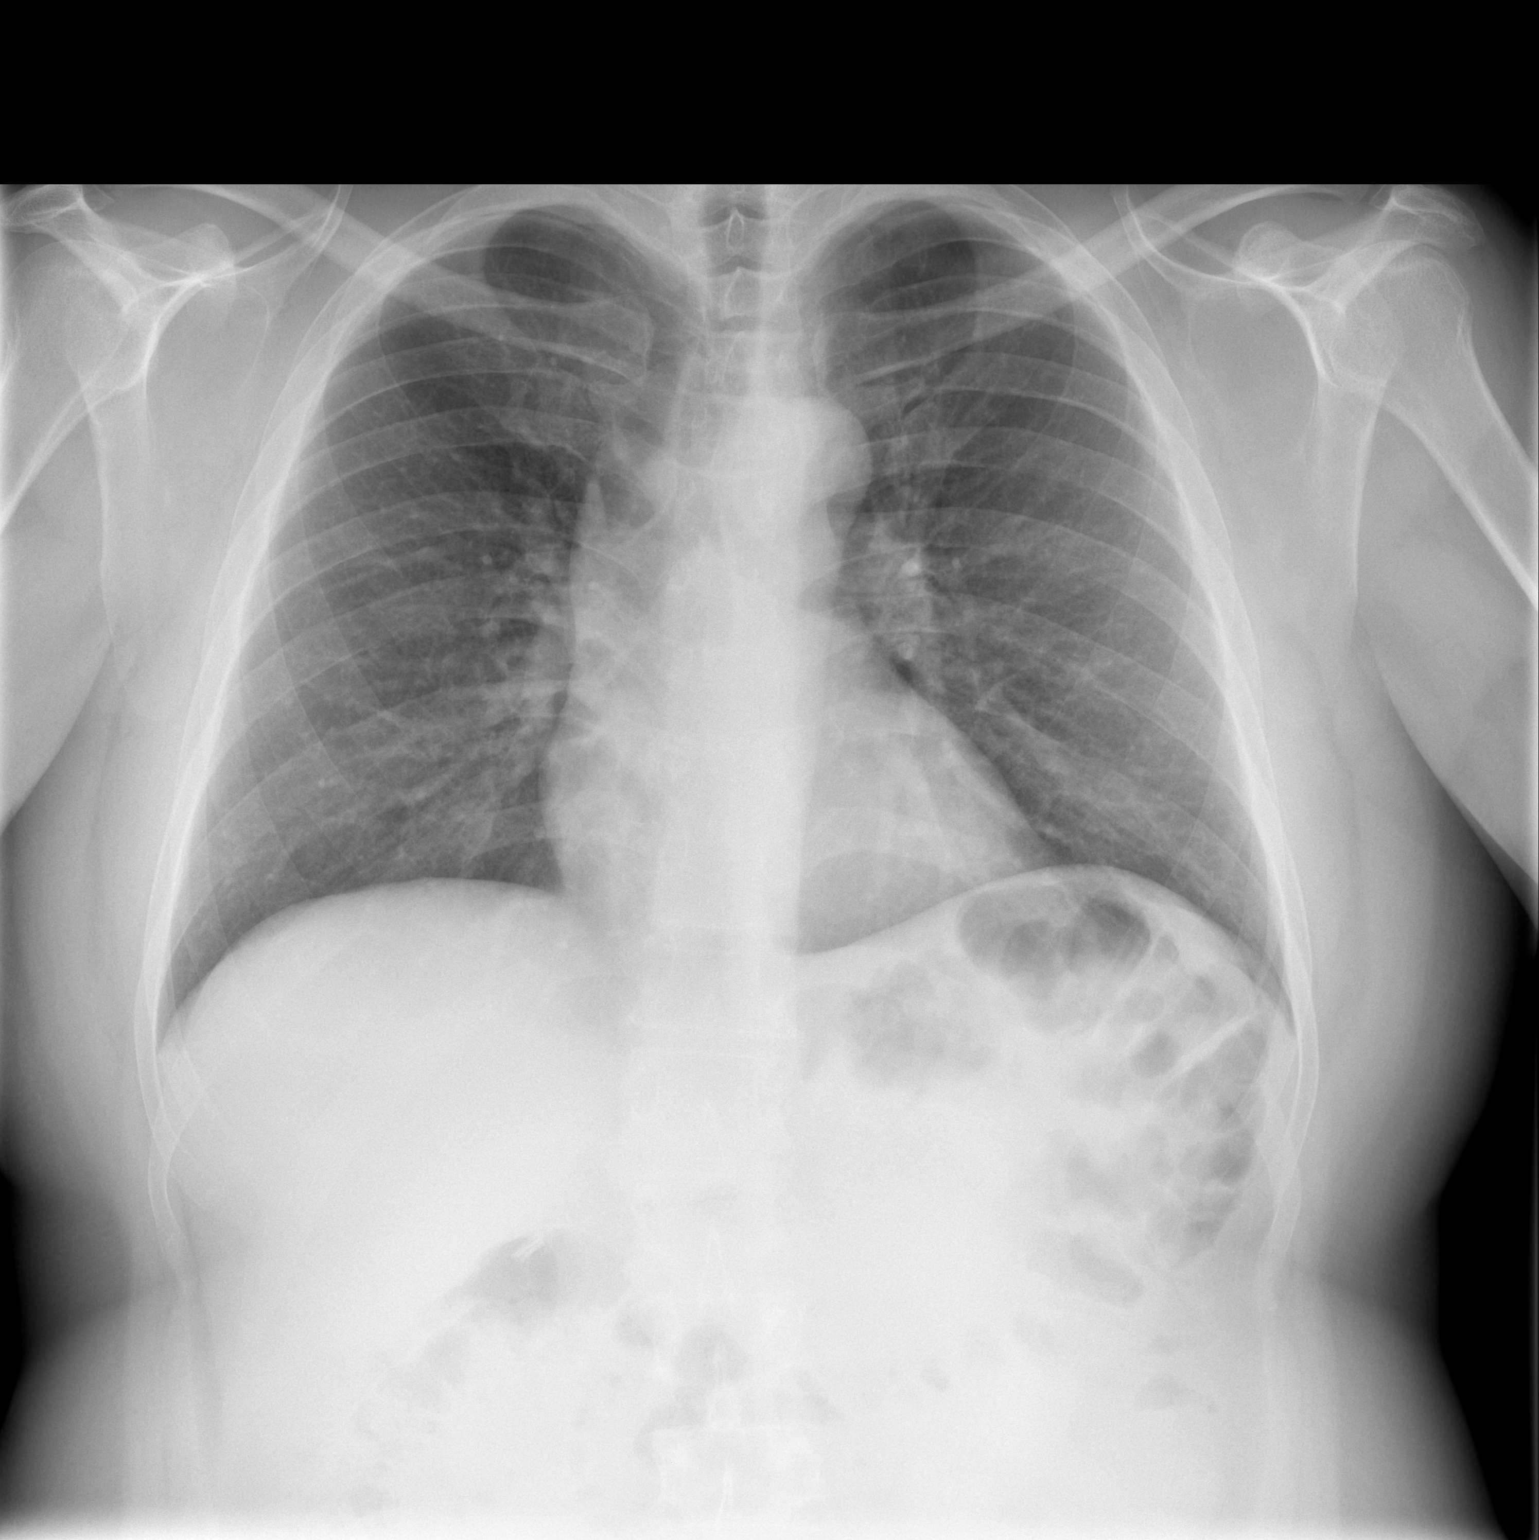

[w chest lat]
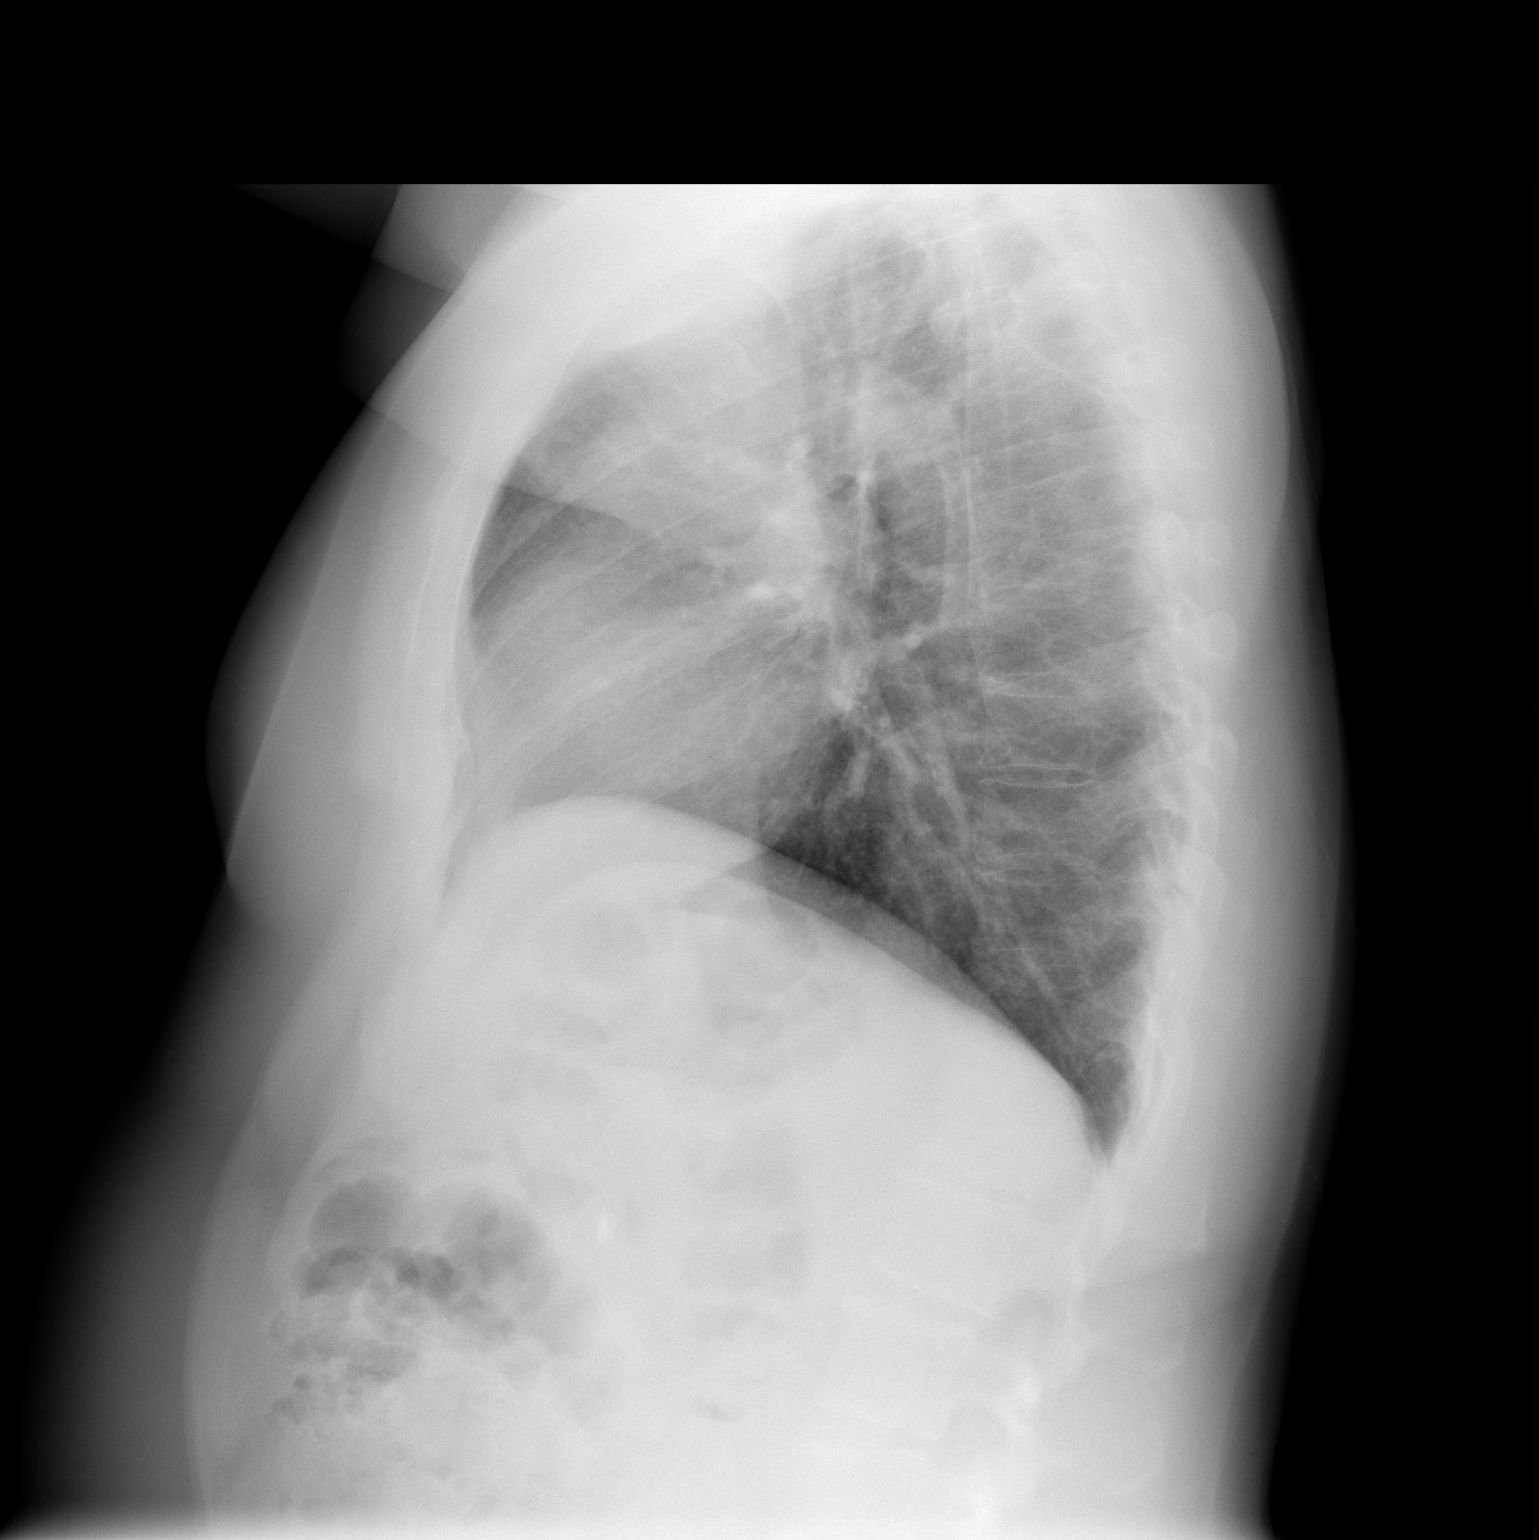

[2 of 2 positions shown; findings below may reference images not displayed]

FINDINGS: The lungs are adequately inflated. There is no focal infiltrate.
There is no pleural effusion. The heart and pulmonary vascularity
are normal. The mediastinum is normal in width. The trachea is
midline. The bony thorax exhibits no acute abnormality.
IMPRESSION: There is no active cardiopulmonary disease.

## 2017-02-13 DIAGNOSIS — H524 Presbyopia: Secondary | ICD-10-CM | POA: Diagnosis not present

## 2017-05-19 ENCOUNTER — Ambulatory Visit (INDEPENDENT_AMBULATORY_CARE_PROVIDER_SITE_OTHER): Payer: BLUE CROSS/BLUE SHIELD

## 2017-05-19 DIAGNOSIS — Z23 Encounter for immunization: Secondary | ICD-10-CM | POA: Diagnosis not present

## 2017-06-08 ENCOUNTER — Telehealth: Payer: Self-pay | Admitting: Family Medicine

## 2017-06-08 NOTE — Telephone Encounter (Signed)
yes

## 2017-06-08 NOTE — Telephone Encounter (Signed)
Copied from [place]. Topic: Quick Communication - See Telephone Encounter >> Jun 08, 2017  9:15 AM Cleaster Corin, NT wrote: CRM for notification. See Telephone encounter for:   06/08/17. Pt. Wants to know if its ok for him to use his oral rinse (chlorhexidine gluconade 0.12%) before his cpe fasting labs on 06/12/17 at 7:55am please call pt. To let him know it will be ok to leave a voicemail. [phone]

## 2017-06-08 NOTE — Telephone Encounter (Signed)
Mr. Guadamuz notified, by telephone, that it is okay to use oral rinse per Dr. Lorelei Pont.

## 2017-06-12 ENCOUNTER — Other Ambulatory Visit (INDEPENDENT_AMBULATORY_CARE_PROVIDER_SITE_OTHER): Payer: BLUE CROSS/BLUE SHIELD

## 2017-06-12 DIAGNOSIS — E538 Deficiency of other specified B group vitamins: Secondary | ICD-10-CM

## 2017-06-12 DIAGNOSIS — E559 Vitamin D deficiency, unspecified: Secondary | ICD-10-CM | POA: Diagnosis not present

## 2017-06-12 DIAGNOSIS — Z0000 Encounter for general adult medical examination without abnormal findings: Secondary | ICD-10-CM

## 2017-06-12 LAB — CBC WITH DIFFERENTIAL/PLATELET
BASOS ABS: 0 10*3/uL (ref 0.0–0.1)
Basophils Relative: 0.8 % (ref 0.0–3.0)
EOS ABS: 0.1 10*3/uL (ref 0.0–0.7)
Eosinophils Relative: 2.5 % (ref 0.0–5.0)
HCT: 46 % (ref 39.0–52.0)
HEMOGLOBIN: 14.9 g/dL (ref 13.0–17.0)
LYMPHS ABS: 1.9 10*3/uL (ref 0.7–4.0)
LYMPHS PCT: 34.7 % (ref 12.0–46.0)
MCHC: 32.5 g/dL (ref 30.0–36.0)
MCV: 86.9 fl (ref 78.0–100.0)
MONO ABS: 0.4 10*3/uL (ref 0.1–1.0)
Monocytes Relative: 6.9 % (ref 3.0–12.0)
NEUTROS PCT: 55.1 % (ref 43.0–77.0)
Neutro Abs: 3.1 10*3/uL (ref 1.4–7.7)
Platelets: 265 10*3/uL (ref 150.0–400.0)
RBC: 5.29 Mil/uL (ref 4.22–5.81)
RDW: 13.3 % (ref 11.5–15.5)
WBC: 5.6 10*3/uL (ref 4.0–10.5)

## 2017-06-12 LAB — BASIC METABOLIC PANEL
BUN: 12 mg/dL (ref 6–23)
CALCIUM: 9.5 mg/dL (ref 8.4–10.5)
CO2: 31 meq/L (ref 19–32)
CREATININE: 1.05 mg/dL (ref 0.40–1.50)
Chloride: 102 mEq/L (ref 96–112)
GFR: 78.9 mL/min (ref >60.00)
GLUCOSE: 109 mg/dL — AB (ref 70–99)
Potassium: 4.1 mEq/L (ref 3.5–5.1)
Sodium: 140 mEq/L (ref 135–145)

## 2017-06-12 LAB — LIPID PANEL
CHOL/HDL RATIO: 4
Cholesterol: 146 mg/dL (ref 0–200)
HDL: 40.4 mg/dL (ref >39.00)
LDL CALC: 83 mg/dL (ref 0–99)
NONHDL: 105.27
TRIGLYCERIDES: 109 mg/dL (ref 0.0–149.0)
VLDL: 21.8 mg/dL (ref 0.0–40.0)

## 2017-06-12 LAB — HEPATIC FUNCTION PANEL
ALK PHOS: 52 U/L (ref 39–117)
ALT: 8 U/L (ref 0–53)
AST: 16 U/L (ref 0–37)
Albumin: 4.4 g/dL (ref 3.5–5.2)
BILIRUBIN DIRECT: 0.2 mg/dL (ref 0.0–0.3)
BILIRUBIN TOTAL: 1.5 mg/dL — AB (ref 0.2–1.2)
Total Protein: 6.6 g/dL (ref 6.0–8.3)

## 2017-06-12 LAB — VITAMIN B12: Vitamin B-12: 138 pg/mL — ABNORMAL LOW (ref 211–911)

## 2017-06-12 LAB — PSA: PSA: 0.94 ng/mL (ref 0.10–4.00)

## 2017-06-12 LAB — VITAMIN D 25 HYDROXY (VIT D DEFICIENCY, FRACTURES): VITD: 17.16 ng/mL — AB (ref 30.00–100.00)

## 2017-06-19 ENCOUNTER — Encounter: Payer: BLUE CROSS/BLUE SHIELD | Admitting: Family Medicine

## 2017-07-05 ENCOUNTER — Other Ambulatory Visit: Payer: BLUE CROSS/BLUE SHIELD

## 2017-07-19 ENCOUNTER — Encounter: Payer: BLUE CROSS/BLUE SHIELD | Admitting: Family Medicine

## 2017-07-20 ENCOUNTER — Telehealth: Payer: Self-pay | Admitting: Family Medicine

## 2017-07-20 MED ORDER — OSELTAMIVIR PHOSPHATE 75 MG PO CAPS: 75.0000 mg | ORAL_CAPSULE | Freq: Every day | ORAL | 0 refills | Status: DC

## 2017-07-20 NOTE — Telephone Encounter (Signed)
I spoke with pts wife and pt does not have any symptoms but wants tamiflu as preventative measure.

## 2017-07-20 NOTE — Telephone Encounter (Signed)
Prophylactic tamiflu is reasonable  Tamiflu 75 mg, 1 po daily, #10

## 2017-07-20 NOTE — Telephone Encounter (Signed)
Mrs Allbritton notified that [facility] [address] for [facility].

## 2017-07-20 NOTE — Telephone Encounter (Signed)
Copied from [place] [phone]. Topic: Quick Communication - See Telephone Encounter >> Jul 20, 2017  4:35 PM Vernona Rieger wrote: CRM for notification. See Telephone encounter for:   07/20/17.  Pt said his son was diagnosed with the flu & wants to know if he can have some tamiflu  [id number] [place], [place] Please call patient if this can be done @ [phone]

## 2017-07-24 ENCOUNTER — Encounter: Payer: BLUE CROSS/BLUE SHIELD | Admitting: Family Medicine

## 2017-07-26 ENCOUNTER — Other Ambulatory Visit: Payer: Self-pay

## 2017-07-26 ENCOUNTER — Encounter: Payer: Self-pay | Admitting: Family Medicine

## 2017-07-26 ENCOUNTER — Ambulatory Visit (INDEPENDENT_AMBULATORY_CARE_PROVIDER_SITE_OTHER): Payer: BLUE CROSS/BLUE SHIELD | Admitting: Family Medicine

## 2017-07-26 VITALS — BP 100/70 | HR 64 | Temp 98.6°F | Ht 65.0 in | Wt 174.5 lb

## 2017-07-26 DIAGNOSIS — Z0000 Encounter for general adult medical examination without abnormal findings: Secondary | ICD-10-CM | POA: Diagnosis not present

## 2017-07-26 MED ORDER — DICLOFENAC SODIUM 75 MG PO TBEC: 75.0000 mg | DELAYED_RELEASE_TABLET | Freq: Two times a day (BID) | ORAL | 1 refills | Status: DC

## 2017-07-26 MED ORDER — METHOCARBAMOL 500 MG PO TABS: 500.0000 mg | ORAL_TABLET | Freq: Three times a day (TID) | ORAL | 1 refills | Status: DC | PRN

## 2017-07-26 NOTE — Progress Notes (Signed)
Dr. Frederico Hamman T. Ileen Kahre, MD, [facility] [place], [address] Phone: [phone] Fax: [phone]  07/26/2017  Patient: Roger Ferguson, MRN: [id number], DOB: 1966/02/17, 52 y.o.  Primary Physician:  Owens Loffler, MD   Chief Complaint  Patient presents with  . Annual Exam   Subjective:   Roger Ferguson is a 52 y.o. pleasant patient who presents with the following:  Preventative Health Maintenance Visit:  Health Maintenance Summary Reviewed and updated, unless pt declines services.  Colon? Dr. Lyndel Safe.   Knees are hurting now and R knee is bothering him some.   GERD: takign some apple cider vinegar, weaning off Dexilant.  Clove - and homeopathic remedy  Has a lot of bloating.  Still working all the time.   Tobacco History Reviewed. Alcohol: No concerns, no excessive use Exercise Habits: Some activity, rec at least 30 mins 5 times a week STD concerns: no risk or activity to increase risk Drug Use: None Encouraged self-testicular check  Health Maintenance  Topic Date Due  . HIV Screening  08/27/1980  . TETANUS/TDAP  06/07/2020  . COLONOSCOPY  07/12/2027  . INFLUENZA VACCINE  Completed   Immunization History  Administered Date(s) Administered  . Influenza Split 03/25/2012  . Influenza Whole 04/24/2010  . Influenza,inj,Quad PF,6+ Mos 04/24/2014, 05/06/2015, 05/02/2016, 05/19/2017  . Influenza-Unspecified 03/25/2013  . Td 06/07/2010   Patient Active Problem List   Diagnosis Date Noted  . GERD (gastroesophageal reflux disease) 02/13/2013  . 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 UNSPECIFIED VITAMIN B DEFICIENCY 03/30/2010  . UNSPECIFIED VITAMIN D DEFICIENCY 03/30/2010  . Backache 10/26/2009  . ANAL FISSURE 01/15/2009   Past Medical History:  Diagnosis Date  . Anal itching    chronic  . GERD (gastroesophageal reflux disease)   . History of hematuria    cystoscopy neg, rads eval  . 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 LAPAROSCOPIC CHOLECYSTECTOMY    . RECTAL BOTOX INJECTION  09-2008   Social History   Socioeconomic History  . Marital status: Married    Spouse name: Not on file  . Number of children: 2  . Years of education: 12+  . Highest education level: Not on file  Social Needs  . Financial resource strain: Not on file  . Food insecurity - worry: Not on file  . Food insecurity - inability: Not on file  . Transportation needs - medical: Not on file  . Transportation needs - non-medical: Not on file  Occupational History  . Occupation: Mudlogger of operations    Comment: Architectural technologist.    Employer: [employer]  Tobacco Use  . Smoking status: Former Research scientist (life sciences)  . Smokeless tobacco: Never Used  Substance and Sexual Activity  . Alcohol use: Yes    Alcohol/week: 0.0 oz  . Drug use: No  . Sexual activity: Yes    Partners: Female  Other Topics Concern  . Not on file  Social History Narrative   Regular exercise---no      Originally from [place]   Married with 2 children         Family History  Problem Relation Age of Onset  . Coronary artery disease Unknown 60   No Known Allergies  Medication list has been reviewed and updated.   General: Denies fever, chills, sweats. No significant weight loss. Eyes: Denies blurring,significant itching ENT: Denies earache, sore throat, and hoarseness. Cardiovascular: Denies chest pains, palpitations, dyspnea  on exertion Respiratory: Denies cough, dyspnea at rest,wheeezing Breast: no concerns about lumps GI: Denies nausea, vomiting, diarrhea, constipation, change in bowel habits, abdominal pain, melena, hematochezia GU: Denies penile discharge, ED, urinary flow / outflow problems. No STD concerns. Musculoskeletal: Denies back pain, joint pain Derm: Denies rash, itching Neuro: Denies  paresthesias, frequent falls, frequent headaches Psych: Denies depression,  anxiety Endocrine: Denies cold intolerance, heat intolerance, polydipsia Heme: Denies enlarged lymph nodes Allergy: No hayfever  Objective:   BP 100/70   Pulse 64   Temp 98.6 F (37 C) (Oral)   Ht '5\' 5"'$  (1.651 m)   Wt 174 lb 8 oz (79.2 kg)   BMI 29.04 kg/m  Ideal Body Weight: Weight in (lb) to have BMI = 25: 149.9  No exam data present  GEN: well developed, well nourished, no acute distress Eyes: conjunctiva and lids normal, PERRLA, EOMI ENT: TM clear, nares clear, oral exam WNL Neck: supple, no lymphadenopathy, no thyromegaly, no JVD Pulm: clear to auscultation and percussion, respiratory effort normal CV: regular rate and rhythm, S1-S2, no murmur, rub or gallop, no bruits, peripheral pulses normal and symmetric, no cyanosis, clubbing, edema or varicosities GI: soft, non-tender; no hepatosplenomegaly, masses; active bowel sounds all quadrants GU: no hernia, testicular mass, penile discharge Lymph: no cervical, axillary or inguinal adenopathy MSK: gait normal, muscle tone and strength WNL, no joint swelling, effusions, discoloration, crepitus  SKIN: clear, good turgor, color WNL, no rashes, lesions, or ulcerations Neuro: normal mental status, normal strength, sensation, and motion Psych: alert; oriented to person, place and time, normally interactive and not anxious or depressed in appearance.  All labs reviewed with patient.  Lipids:    Component Value Date/Time   CHOL 146 06/12/2017 0937   TRIG 109.0 06/12/2017 0937   HDL 40.40 06/12/2017 0937   VLDL 21.8 06/12/2017 0937   CHOLHDL 4 06/12/2017 0937   CBC: CBC Latest Ref Rng & Units 06/12/2017 07/01/2016 07/01/2015  WBC 4.0 - 10.5 K/uL 5.6 7.2 7.4  Hemoglobin 13.0 - 17.0 g/dL 14.9 15.2 15.3  Hematocrit 39.0 - 52.0 % 46.0 45.2 47.1  Platelets 150.0 - 400.0 K/uL 265.0 275.0 740.8    Basic Metabolic Panel:    Component Value Date/Time   NA 140 06/12/2017 0937   K 4.1 06/12/2017 0937   CL 102 06/12/2017 0937    CO2 31 06/12/2017 0937   BUN 12 06/12/2017 0937   CREATININE 1.05 06/12/2017 0937   CREATININE 0.96 07/02/2013 1739   GLUCOSE 109 (H) 06/12/2017 0937   CALCIUM 9.5 06/12/2017 0937   Hepatic Function Latest Ref Rng & Units 06/12/2017 07/01/2016 07/01/2015  Total Protein 6.0 - 8.3 g/dL 6.6 6.9 6.8  Albumin 3.5 - 5.2 g/dL 4.4 4.3 4.3  AST 0 - 37 U/L '16 14 18  '$ ALT 0 - 53 U/L '8 8 10  '$ Alk Phosphatase 39 - 117 U/L 52 59 65  Total Bilirubin 0.2 - 1.2 mg/dL 1.5(H) 0.7 0.6  Bilirubin, Direct 0.0 - 0.3 mg/dL 0.2 0.1 0.1    Lab Results  Component Value Date   TSH 2.34 07/01/2016   Lab Results  Component Value Date   PSA 0.94 06/12/2017   PSA 1.27 07/01/2016   PSA 1.02 07/01/2015    Assessment and Plan:   Healthcare maintenance  Cont to work on weight and fitness.   Health Maintenance Exam: The patient's preventative maintenance and recommended screening tests for an annual wellness exam were reviewed in full today. Brought up to date unless services  declined.  Counselled on the importance of diet, exercise, and its role in overall health and mortality. The patient's FH and SH was reviewed, including their home life, tobacco status, and drug and alcohol status.  Follow-up in 1 year for physical exam or additional follow-up below.  Follow-up: No Follow-up on file. Or follow-up in 1 year if not noted.  Meds ordered this encounter  Medications  . diclofenac (VOLTAREN) 75 MG EC tablet    Sig: Take 1 tablet (75 mg total) by mouth 2 (two) times daily. Prn joint pain    Dispense:  90 tablet    Refill:  1  . methocarbamol (ROBAXIN) 500 MG tablet    Sig: Take 1 tablet (500 mg total) by mouth 3 (three) times daily as needed for muscle spasms.    Dispense:  90 tablet    Refill:  1   Signed,  Ethen Bannan T. Sharlot Sturkey, MD   Allergies as of 07/26/2017   No Known Allergies     Medication List        Accurate as of 07/26/17 12:30 PM. Always use your most recent med list.           clotrimazole 1 % cream Commonly known as:  LOTRIMIN Apply 1 application topically 2 (two) times daily.   DEXILANT 60 MG capsule Generic drug:  dexlansoprazole Take 60 mg by mouth daily.   diclofenac 75 MG EC tablet Commonly known as:  VOLTAREN Take 1 tablet (75 mg total) by mouth 2 (two) times daily. Prn joint pain   fluticasone 0.005 % ointment Commonly known as:  CUTIVATE Apply 1 application topically 2 (two) times daily.   hydrocortisone 2.5 % ointment Apply topically 2 (two) times daily as needed. No more than 7 days in a row.   methocarbamol 500 MG tablet Commonly known as:  ROBAXIN Take 1 tablet (500 mg total) by mouth 3 (three) times daily as needed for muscle spasms.   ranitidine 150 MG tablet Commonly known as:  ZANTAC Take 150 mg by mouth once.   vitamin B-12 1000 MCG tablet Commonly known as:  CYANOCOBALAMIN Take 2 tablets (2,000 mcg total) by mouth daily.   Vitamin D 2000 units tablet Take 1 tablet (2,000 Units total) by mouth daily.

## 2017-07-31 ENCOUNTER — Encounter: Payer: Self-pay | Admitting: Family Medicine

## 2017-09-04 ENCOUNTER — Other Ambulatory Visit: Payer: Self-pay | Admitting: Family Medicine

## 2017-09-04 MED ORDER — VITAMIN B-12 1000 MCG PO TABS: 2000.0000 ug | ORAL_TABLET | Freq: Every day | ORAL | 3 refills | Status: DC

## 2017-09-04 NOTE — Telephone Encounter (Signed)
LOV: 07/26/17 with Dr. Lorelei Pont Last OV addressing Vit B12 on 07/18/16  Dr. Frederico Hamman [place] [facility] [address]

## 2017-09-04 NOTE — Telephone Encounter (Signed)
Routed to [employer] in error. Please disregard.

## 2017-09-04 NOTE — Telephone Encounter (Signed)
Copied from [place] [id number]. Topic: Quick Communication - Rx Refill/Question >> Sep 04, 2017  9:26 AM Marin Olp L wrote: Medication: vitamin B-12 (CYANOCOBALAMIN) 1000 MCG tablet  Has the patient contacted their pharmacy? Yes.    (Agent: If no, request that the patient contact the pharmacy for the refill.)  Preferred Pharmacy (with phone number or street name): [id number] [place] [place] - [address] [place] [address] Phone: [phone] Fax: [phone]   Agent: Please be advised that RX refills may take up to 3 business days. We ask that you follow-up with your pharmacy.

## 2017-09-15 ENCOUNTER — Telehealth: Payer: Self-pay | Admitting: Family Medicine

## 2017-09-15 NOTE — Telephone Encounter (Signed)
Copied from [place] [id number]. Topic: Quick Communication - See Telephone Encounter >> Sep 15, 2017  2:29 PM Ether Griffins B wrote: CRM for notification. See Telephone encounter for:  Pt was prescirbed vitamin B-12 (CYANOCOBALAMIN) 1000 MCG tablet but bought the 5000 mcg. He is wanting to know if this is ok, how he should take these and or if he needs to return them. Please advise.  09/15/17.

## 2017-09-16 NOTE — Telephone Encounter (Signed)
It should be fine - I would just cut them in half.

## 2017-09-18 NOTE — Telephone Encounter (Signed)
Left message for Mr Roger Ferguson that Dr. Lorelei Pont thought the 5000 mcg would be fine.  He just recommends cutting the tablet in half.

## 2018-03-19 ENCOUNTER — Ambulatory Visit: Payer: Self-pay | Admitting: *Deleted

## 2018-03-19 ENCOUNTER — Ambulatory Visit: Payer: BLUE CROSS/BLUE SHIELD | Admitting: Family Medicine

## 2018-03-19 ENCOUNTER — Encounter: Payer: Self-pay | Admitting: Family Medicine

## 2018-03-19 VITALS — BP 104/64 | HR 71 | Temp 99.2°F | Ht 65.0 in | Wt 176.2 lb

## 2018-03-19 DIAGNOSIS — J029 Acute pharyngitis, unspecified: Secondary | ICD-10-CM

## 2018-03-19 DIAGNOSIS — R21 Rash and other nonspecific skin eruption: Secondary | ICD-10-CM | POA: Diagnosis not present

## 2018-03-19 DIAGNOSIS — L918 Other hypertrophic disorders of the skin: Secondary | ICD-10-CM | POA: Diagnosis not present

## 2018-03-19 DIAGNOSIS — J069 Acute upper respiratory infection, unspecified: Secondary | ICD-10-CM

## 2018-03-19 LAB — POCT RAPID STREP A (OFFICE): RAPID STREP A SCREEN: NEGATIVE

## 2018-03-19 MED ORDER — HYDROCODONE-HOMATROPINE 5-1.5 MG/5ML PO SYRP: 5.0000 mL | ORAL_SOLUTION | Freq: Three times a day (TID) | ORAL | 0 refills | Status: DC | PRN

## 2018-03-19 MED ORDER — TRIAMCINOLONE ACETONIDE 0.1 % EX CREA: 1.0000 "application " | TOPICAL_CREAM | Freq: Two times a day (BID) | CUTANEOUS | 0 refills | Status: DC

## 2018-03-19 NOTE — Telephone Encounter (Signed)
Pt calling to ask for advice for a fever of 101.5 that was taken around 6 pm today. Pt was seen in the office on today and was diagnosed with a URI. Pt states his head feels stuffy and has a sore throat, but strep test was negative. Pt states he has not taken any OTC medications to treat fever. Pt given home care advice for fever. Pt advised that if symptoms became worse to return call to the office. Pt verbalized understanding.   Reason for Disposition . [1] Fever AND [2] no signs of serious infection or localizing symptoms (all other triage questions negative)  Answer Assessment - Initial Assessment Questions 1. TEMPERATURE: "What is the most recent temperature?"  "How was it measured?"      101.5 2. ONSET: "When did the fever start?"      Today around 6 pm 3. SYMPTOMS: "Do you have any other symptoms besides the fever?"  (e.g., colds, headache, sore throat, earache, cough, rash, diarrhea, vomiting, abdominal pain)     Pt seen in OV today for URI 4. CAUSE: If there are no symptoms, ask: "What do you think is causing the fever?"      Seen in OV today for URI 5. CONTACTS: "Does anyone else in the family have an infection?"     n/a 6. TREATMENT: "What have you done so far to treat this fever?" (e.g., medications)     Nothing 7. IMMUNOCOMPROMISE: "Do you have of the following: diabetes, HIV positive, splenectomy, cancer chemotherapy, chronic steroid treatment, transplant patient, etc."     No 8. PREGNANCY: "Is there any chance you are pregnant?" "When was your last menstrual period?"     n/a 9. TRAVEL: "Have you traveled out of the country in the last month?" (e.g., travel history, exposures)     n/a  Protocols used: FEVER-A-AH

## 2018-03-19 NOTE — Progress Notes (Signed)
Dr. Frederico Hamman T. Foster Sonnier, MD, [facility] [place], [address] Phone: [phone] Fax: [phone]  03/19/2018  Patient: Roger Ferguson, MRN: [id number], DOB: 11-Jul-1966, 52 y.o.  Primary Physician:  Owens Loffler, MD   Chief Complaint  Patient presents with  . Sore Throat  . Cough  . Skin Tag    Under Right Arm-Red and painful   Subjective:   Roger Ferguson is a 52 y.o. very pleasant male patient who presents with the following:  Couple of days ago, got some sore throat and took some nyquil and went to sleep. Throat was hurting a lot. Did some hot water gargles. Lying down could not sleep. Some drainage and dry cough. Some   Sore throat is the worst for him  R underarm rash  Skin tag on the R axilla  Past Medical History, Surgical History, Social History, Family History, Problem List, Medications, and Allergies have been reviewed and updated if relevant.  Patient Active Problem List   Diagnosis Date Noted  . GERD (gastroesophageal reflux disease) 02/13/2013  . 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 UNSPECIFIED VITAMIN B DEFICIENCY 03/30/2010  . UNSPECIFIED VITAMIN D DEFICIENCY 03/30/2010  . Backache 10/26/2009  . ANAL FISSURE 01/15/2009    Past Medical History:  Diagnosis Date  . Anal itching    chronic  . GERD (gastroesophageal reflux disease)   . History of hematuria    cystoscopy neg, rads eval  . 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 LAPAROSCOPIC CHOLECYSTECTOMY    . RECTAL BOTOX INJECTION  09-2008    Social History   Socioeconomic History  . Marital status: Married    Spouse name: Not on file  . Number of children: 2  . Years of education: 12+  . Highest education level: Not on file  Occupational History  . Occupation: Mudlogger of operations    Comment: Architectural technologist.    Employer: [employer]  Social Needs  . Financial resource strain:  Not on file  . Food insecurity:    Worry: Not on file    Inability: Not on file  . Transportation needs:    Medical: Not on file    Non-medical: Not on file  Tobacco Use  . Smoking status: Former Research scientist (life sciences)  . Smokeless tobacco: Never Used  Substance and Sexual Activity  . Alcohol use: Yes    Alcohol/week: 0.0 standard drinks  . Drug use: No  . Sexual activity: Yes    Partners: Female  Lifestyle  . Physical activity:    Days per week: Not on file    Minutes per session: Not on file  . Stress: Not on file  Relationships  . Social connections:    Talks on phone: Not on file    Gets together: Not on file    Attends religious service: Not on file    Active member of club or organization: Not on file    Attends meetings of clubs or organizations: Not on file    Relationship status: Not on file  . Intimate partner violence:    Fear of current or ex partner: Not on file    Emotionally abused: Not on file    Physically abused: Not on file    Forced sexual activity: Not on file  Other Topics Concern  . Not on file  Social History Narrative   Regular exercise---no      Originally from [place] (  [place])   Married with 2 children          Family History  Problem Relation Age of Onset  . Coronary artery disease Unknown 60    No Known Allergies  Medication list reviewed and updated in full in [place].  ROS: GEN: Acute illness details above GI: Tolerating PO intake GU: maintaining adequate hydration and urination Pulm: No SOB Interactive and getting along well at home.  Otherwise, ROS is as per the HPI.  Objective:   BP 104/64   Pulse 71   Temp 99.2 F (37.3 C) (Oral)   Ht 5\' 5"  (1.651 m)   Wt 176 lb 4 oz (79.9 kg)   SpO2 98%   BMI 29.33 kg/m    Gen: WDWN, NAD; A & O x3, cooperative. Pleasant.Globally Non-toxic HEENT: Normocephalic and atraumatic. Throat clear, w/o exudate, R TM clear, L TM - good landmarks, No fluid present. rhinnorhea.  MMM Frontal  sinuses: NT Max sinuses: NT NECK: Anterior cervical  LAD is absent CV: RRR, No M/G/R, cap refill <2 sec PULM: Breathing comfortably in no respiratory distress. no wheezing, crackles, rhonchi EXT: No c/c/e PSYCH: Friendly, good eye contact MSK: Nml gait   Few skin tags under r arm, 1 irritated   Laboratory and Imaging Data: Results for orders placed or performed in visit on 03/19/18  POCT rapid strep A  Result Value Ref Range   Rapid Strep A Screen Negative Negative     Assessment and Plan:   URI, acute  Sore throat - Plan: POCT rapid strep A  Inflamed skin tag  Rash  Reassurance and supportive care  Follow-up: No follow-ups on file.  Meds ordered this encounter  Medications  . triamcinolone cream (KENALOG) 0.1 %    Sig: Apply 1 application topically 2 (two) times daily.    Dispense:  454 g    Refill:  0  . HYDROcodone-homatropine (HYCODAN) 5-1.5 MG/5ML syrup    Sig: Take 5 mLs by mouth every 8 (eight) hours as needed for cough.    Dispense:  120 mL    Refill:  0   Orders Placed This Encounter  Procedures  . POCT rapid strep A    Signed,  Daijon Wenke T. Danniella Robben, MD   Allergies as of 03/19/2018   No Known Allergies     Medication List        Accurate as of 03/19/18  1:43 PM. Always use your most recent med list.          clotrimazole 1 % cream Commonly known as:  LOTRIMIN Apply 1 application topically 2 (two) times daily.   DEXILANT 60 MG capsule Generic drug:  dexlansoprazole Take 60 mg by mouth daily.   diclofenac 75 MG EC tablet Commonly known as:  VOLTAREN Take 1 tablet (75 mg total) by mouth 2 (two) times daily. Prn joint pain   fluticasone 0.005 % ointment Commonly known as:  CUTIVATE Apply 1 application topically 2 (two) times daily.   HYDROcodone-homatropine 5-1.5 MG/5ML syrup Commonly known as:  HYCODAN Take 5 mLs by mouth every 8 (eight) hours as needed for cough.   hydrocortisone 2.5 % ointment Apply topically 2 (two) times daily  as needed. No more than 7 days in a row.   methocarbamol 500 MG tablet Commonly known as:  ROBAXIN Take 1 tablet (500 mg total) by mouth 3 (three) times daily as needed for muscle spasms.   ranitidine 150 MG tablet Commonly known as:  ZANTAC Take 150  mg by mouth once.   triamcinolone cream 0.1 % Commonly known as:  KENALOG Apply 1 application topically 2 (two) times daily.   vitamin B-12 1000 MCG tablet Commonly known as:  CYANOCOBALAMIN Take 2 tablets (2,000 mcg total) by mouth daily.

## 2018-05-03 ENCOUNTER — Ambulatory Visit (INDEPENDENT_AMBULATORY_CARE_PROVIDER_SITE_OTHER): Payer: BLUE CROSS/BLUE SHIELD

## 2018-05-03 ENCOUNTER — Other Ambulatory Visit: Payer: Self-pay | Admitting: Family Medicine

## 2018-05-03 DIAGNOSIS — Z23 Encounter for immunization: Secondary | ICD-10-CM | POA: Diagnosis not present

## 2018-05-03 DIAGNOSIS — K648 Other hemorrhoids: Secondary | ICD-10-CM

## 2018-05-03 DIAGNOSIS — L309 Dermatitis, unspecified: Secondary | ICD-10-CM

## 2018-05-03 NOTE — Telephone Encounter (Signed)
Last office visit 03/19/2018 for URI.  Last refilled Hydrocortisone 06/01/16 for 40 g and Fluticasone 06/01/16 for 60 g with 1 refill.  Patient would like a large quantity for the Hydrocortisone if it comes larger that 40 g.  CPE scheduled 06/20/2018.  Ok to refill?

## 2018-05-03 NOTE — Telephone Encounter (Signed)
Pt is requesting refills on hydrocortisone and fluticasone to be called into [facility] on [address].

## 2018-05-04 MED ORDER — FLUTICASONE PROPIONATE 0.005 % EX OINT: 1.0000 "application " | TOPICAL_OINTMENT | Freq: Two times a day (BID) | CUTANEOUS | 1 refills | Status: DC

## 2018-05-04 MED ORDER — HYDROCORTISONE 2.5 % EX OINT: TOPICAL_OINTMENT | Freq: Two times a day (BID) | CUTANEOUS | 0 refills | Status: AC | PRN

## 2018-05-08 ENCOUNTER — Ambulatory Visit: Payer: Self-pay

## 2018-05-08 NOTE — Telephone Encounter (Signed)
Pt called with C/O funny taste in his mouth.States he noticed this for 2-3  days. He states that he went and got a peroxide based mouth rinse and it turned the under portion of his tongue red. He states he has seen a lesion on inside of lower lip and feels it may be slightly swollen. Pt needed to hang up to attend meeting. Requested I make appointment and call him back Per protocol appointment made for him tomorrow. Left VM for patient to call back to confirm and speak with Triage for care advice.   Reason for Disposition . White patches that stick to tongue or inner cheek  Answer Assessment - Initial Assessment Questions 1. SYMPTOM: "What's the main symptom you're concerned about?" (e.g., dry mouth. chapped lips, lump)     Funny taste 2. ONSET: "When did the  2-3 days  start?"     2-3 3. PAIN: "Is there any pain?" If so, ask: "How bad is it?" (Scale: 1-10; mild, moderate, severe)     1-2 discomfort 4. CAUSE: "What do you think is causing the symptoms?"     Peroxide mouth rinse made it worse 5. OTHER SYMPTOMS: "Do you have any other symptoms?" (e.g., fever, sore throat, toothache, swelling)     Lesion to bottom lip 6. PREGNANCY: "Is there any chance you are pregnant?" "When was your last menstrual period?"     N/A  Protocols used: MOUTH [facility]

## 2018-05-08 NOTE — Telephone Encounter (Signed)
Noted  

## 2018-05-08 NOTE — Telephone Encounter (Signed)
Pt has appt 05/09/18 at 4 pm wti Glenda Chroman FNP.

## 2018-05-09 ENCOUNTER — Ambulatory Visit: Payer: BLUE CROSS/BLUE SHIELD | Admitting: Family Medicine

## 2018-05-10 ENCOUNTER — Ambulatory Visit: Payer: BLUE CROSS/BLUE SHIELD | Admitting: Family Medicine

## 2018-05-10 ENCOUNTER — Encounter: Payer: Self-pay | Admitting: Family Medicine

## 2018-05-10 VITALS — BP 102/72 | HR 70 | Temp 98.4°F | Ht 65.0 in | Wt 176.4 lb

## 2018-05-10 DIAGNOSIS — J029 Acute pharyngitis, unspecified: Secondary | ICD-10-CM | POA: Diagnosis not present

## 2018-05-10 DIAGNOSIS — K121 Other forms of stomatitis: Secondary | ICD-10-CM

## 2018-05-10 LAB — POCT RAPID STREP A (OFFICE): RAPID STREP A SCREEN: NEGATIVE

## 2018-05-10 MED ORDER — ACYCLOVIR 200 MG PO CAPS: 200.0000 mg | ORAL_CAPSULE | Freq: Every day | ORAL | 0 refills | Status: DC

## 2018-05-10 NOTE — Progress Notes (Signed)
Subjective:  Roger Ferguson is a 52 y.o. year old very pleasant male patient who presents for/with See problem oriented charting ROS- no fever, chills, nausea, vomiting. Has had sore throat, cough- light sputum production   Past Medical History-  Patient Active Problem List   Diagnosis Date Noted  . GERD (gastroesophageal reflux disease) 02/13/2013  . 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 UNSPECIFIED VITAMIN B DEFICIENCY 03/30/2010  . UNSPECIFIED VITAMIN D DEFICIENCY 03/30/2010  . Backache 10/26/2009  . ANAL FISSURE 01/15/2009    Medications- reviewed and updated Current Outpatient Medications  Medication Sig Dispense Refill  . clotrimazole (LOTRIMIN) 1 % cream Apply 1 application topically 2 (two) times daily. 60 g 5  . DEXILANT 60 MG capsule Take 60 mg by mouth daily.   10  . diclofenac (VOLTAREN) 75 MG EC tablet Take 1 tablet (75 mg total) by mouth 2 (two) times daily. Prn joint pain 90 tablet 1  . fluticasone (CUTIVATE) 0.005 % ointment Apply 1 application topically 2 (two) times daily. 60 g 1  . HYDROcodone-homatropine (HYCODAN) 5-1.5 MG/5ML syrup Take 5 mLs by mouth every 8 (eight) hours as needed for cough. 120 mL 0  . hydrocortisone 2.5 % ointment Apply topically 2 (two) times daily as needed. No more than 7 days in a row. 60 g 0  . methocarbamol (ROBAXIN) 500 MG tablet Take 1 tablet (500 mg total) by mouth 3 (three) times daily as needed for muscle spasms. 90 tablet 1  . ranitidine (ZANTAC) 150 MG tablet Take 150 mg by mouth once.    . triamcinolone cream (KENALOG) 0.1 % Apply 1 application topically 2 (two) times daily. 454 g 0  . vitamin B-12 (CYANOCOBALAMIN) 1000 MCG tablet Take 2 tablets (2,000 mcg total) by mouth daily. 180 tablet 3  . acyclovir (ZOVIRAX) 200 MG capsule Take 1 capsule (200 mg total) by mouth 5 (five) times daily. 25 capsule 0   No current facility-administered medications for this visit.     Objective: BP 102/72 (BP Location: Left Arm, Patient Position:  Sitting, Cuff Size: Large)   Pulse 70   Temp 98.4 F (36.9 C) (Oral)   Ht 5\' 5"  (1.651 m)   Wt 176 lb 6.4 oz (80 kg)   SpO2 95%   BMI 29.35 kg/m  Gen: NAD, resting comfortably, intermittent cough  Mild erythema in pharynx, under tongue there shallow ulceration with surrounding erythema. There are several other smaller lesions in mouth. Bump on left lower lip as well.  CV: RRR no murmurs rubs or gallops Lungs: CTAB no crackles, wheeze, rhonchi Abdomen: soft/nontender/nondistended Ext: no edema Skin: warm, dry     Assessment/Plan:  Sore throat - Plan: POCT rapid strep A  Oral ulceration S: 2-3 days ago woke up with feeling of discomfort in mouth. In past has gone away with vitamin B complex (noted it was expired). Went and got some more and got some colgate hydroxy for sores in mouth- did gargle and could see some white spots/lesions in mouth. The next day could note some swelling in his lips so stopped colgate hydroxy- went with salt water gargles  Magic mouthwash- maalox, lidocaine- possibly nystatin at night tried x1 (wife had old bottle). Salt water gargles started back after that. Last night and did nyquil and salt water gargles- had some throat   Had blisters under both lips, at back of gum, on sides of mouth- not sure if had any in back of throat.   He also has had a few days  of cough and sore throat  No history fever. Does have some history oral ulceration- apthous ulcers - possibly. Married- not sexually active outside of marriage and not concerned about wife being active outside of marriage. Son is 5 and daughter is 72. Wife was told had viral infection - she has had some sore throat- she did not have sores in mouth  dexilant-lower dose at 30 mg- only recent changeand went back to 65 A/P: 52 year old male with mild sore throat and cough I suspect from viral URI. In addition has oral ulcers- I lean towards herpetic stomatitis. See discussion form avs. Did not write about  thrush but given multiple diffuse ulcers/sores- think less likely thrush . monogomous relationship- doubt STI and no testing done.  "Possible causes 1. This could be herpetic stomatitis (a first exposure to herpes which can cause painful rash in the mouth). Herpes is very common and 50% of population has it so easy to get exposed 2. Doubt drug related rash as no new medications recently 3. Could be hand foot and mouth (a viral infection) 4. Doubt strep- Strep test negative  If you get a rash on hands and feet- this would point towards hand foot and mouth (sometimes they show up later). I still think this is less likely since you aren't around younger kids.   I sent in some acyclovir for you to try for 5 days to see if this clears it up/helps with pain.   See [place] back for new or worsening symptoms or failure to improve over next 7-10 days"  After discussion of AVS- he prefers to wait a few days and see if he is still improving before taking the acyclovir.   Future Appointments  Date Time Provider [place]  06/12/2018  8:15 AM [facility]  06/20/2018  8:20 AM [place], Frederico Hamman, MD [facility]   Lab/Order associations: Sore throat - Plan: POCT rapid strep A  Oral ulceration  Meds ordered this encounter  Medications  . acyclovir (ZOVIRAX) 200 MG capsule    Sig: Take 1 capsule (200 mg total) by mouth 5 (five) times daily.    Dispense:  25 capsule    Refill:  0   Return precautions advised.  [name], MD

## 2018-05-10 NOTE — Patient Instructions (Signed)
Possible causes 1. This could be herpetic stomatitis (a first exposure to herpes which can cause painful rash in the mouth). Herpes is very common and 50% of population has it so easy to get exposed 2. Doubt drug related rash as no new medications recently 3. Could be hand foot and mouth (a viral infection) 4. Doubt strep- Strep test negative  If you get a rash on hands and feet- this would point towards hand foot and mouth (sometimes they show up later). I still think this is less likely since you aren't around younger kids.   I sent in some acyclovir for you to try for 5 days to see if this clears it up/helps with pain.   See [place] back for new or worsening symptoms or failure to improve over next 7-10 days

## 2018-05-27 ENCOUNTER — Other Ambulatory Visit: Payer: Self-pay | Admitting: Gastroenterology

## 2018-05-28 ENCOUNTER — Telehealth: Payer: Self-pay | Admitting: Family Medicine

## 2018-05-28 ENCOUNTER — Other Ambulatory Visit: Payer: Self-pay | Admitting: Gastroenterology

## 2018-05-28 NOTE — Telephone Encounter (Signed)
Patient called asking if Dr Lorelei Pont would refill his Rx for Dexilant 60 mg capsule. I have inform patient that Dr Lorelei Pont is out of the office. Patient asked if another provider that seen him before would refill it. Please advise.

## 2018-05-29 ENCOUNTER — Other Ambulatory Visit: Payer: Self-pay

## 2018-05-29 MED ORDER — DEXLANSOPRAZOLE 60 MG PO CPDR: 60.0000 mg | DELAYED_RELEASE_CAPSULE | Freq: Every day | ORAL | 11 refills | Status: DC

## 2018-05-29 NOTE — Telephone Encounter (Signed)
Yes... Send me refill requests for Dr. Lorelei Pont

## 2018-05-29 NOTE — Telephone Encounter (Signed)
Noted. Patient have called GI and they have already refill Rx.

## 2018-05-29 NOTE — Progress Notes (Signed)
Sent refills to patients pharmacy.  

## 2018-06-06 ENCOUNTER — Telehealth: Payer: Self-pay | Admitting: *Deleted

## 2018-06-06 NOTE — Telephone Encounter (Signed)
Pt was scheduling his CPE and labs for Dr. Lorelei Pont next month. Pt mentioned to Shirlean Mylar that he has been a little more SOB when he talks so she transferred him to triage. Pt advise me that he isn't having any new sxs besides the SOB when talking or exertion (mainly going up and down stairs) . Pt has no dizziness, no CP, no fatigue or any new sxs. Pt said this has been going on for a few weeks and the only reason he mentioned it was because he has a lab appt scheduled on 06/12/18 and he didn't know if Dr. Lorelei Pont wants to do any additional labs or if he could get a chest X-ray while he is here for labs. Pt said he has already seen a cardiologist and had a full work up and everything was fine. Pt declined to schedule an appt next week with another provider he said it's not really severe he can wait until his CPE to addressed with PCP. Pt advise if sxs worsen or if he develops any new sxs to go to ER or call us back asap for an appt with another provider.

## 2018-06-09 NOTE — Telephone Encounter (Signed)
Reviewed. Full recent cardiac work-up. Well-informed patient - ok to f/u if he has concerns.

## 2018-06-12 ENCOUNTER — Other Ambulatory Visit (INDEPENDENT_AMBULATORY_CARE_PROVIDER_SITE_OTHER): Payer: BLUE CROSS/BLUE SHIELD

## 2018-06-12 ENCOUNTER — Telehealth (INDEPENDENT_AMBULATORY_CARE_PROVIDER_SITE_OTHER): Payer: BLUE CROSS/BLUE SHIELD | Admitting: Family Medicine

## 2018-06-12 DIAGNOSIS — E559 Vitamin D deficiency, unspecified: Secondary | ICD-10-CM

## 2018-06-12 DIAGNOSIS — Z13220 Encounter for screening for lipoid disorders: Secondary | ICD-10-CM | POA: Diagnosis not present

## 2018-06-12 DIAGNOSIS — E538 Deficiency of other specified B group vitamins: Secondary | ICD-10-CM

## 2018-06-12 LAB — CBC WITH DIFFERENTIAL/PLATELET
Basophils Absolute: 0 10*3/uL (ref 0.0–0.1)
Basophils Relative: 0.6 % (ref 0.0–3.0)
Eosinophils Absolute: 0.2 10*3/uL (ref 0.0–0.7)
Eosinophils Relative: 2.2 % (ref 0.0–5.0)
HEMATOCRIT: 44.6 % (ref 39.0–52.0)
Hemoglobin: 15 g/dL (ref 13.0–17.0)
LYMPHS PCT: 30.6 % (ref 12.0–46.0)
Lymphs Abs: 2.1 10*3/uL (ref 0.7–4.0)
MCHC: 33.7 g/dL (ref 30.0–36.0)
MCV: 85.3 fl (ref 78.0–100.0)
Monocytes Absolute: 0.5 10*3/uL (ref 0.1–1.0)
Monocytes Relative: 7.8 % (ref 3.0–12.0)
Neutro Abs: 4 10*3/uL (ref 1.4–7.7)
Neutrophils Relative %: 58.8 % (ref 43.0–77.0)
Platelets: 235 10*3/uL (ref 150.0–400.0)
RBC: 5.23 Mil/uL (ref 4.22–5.81)
RDW: 13.4 % (ref 11.5–15.5)
WBC: 6.8 10*3/uL (ref 4.0–10.5)

## 2018-06-12 LAB — VITAMIN D 25 HYDROXY (VIT D DEFICIENCY, FRACTURES): VITD: 19.61 ng/mL — ABNORMAL LOW (ref 30.00–100.00)

## 2018-06-12 LAB — COMPREHENSIVE METABOLIC PANEL
ALT: 9 U/L (ref 0–53)
AST: 14 U/L (ref 0–37)
Albumin: 4.4 g/dL (ref 3.5–5.2)
Alkaline Phosphatase: 54 U/L (ref 39–117)
BUN: 14 mg/dL (ref 6–23)
CALCIUM: 9.6 mg/dL (ref 8.4–10.5)
CO2: 31 mEq/L (ref 19–32)
Chloride: 103 mEq/L (ref 96–112)
Creatinine, Ser: 1.07 mg/dL (ref 0.40–1.50)
GFR: 76.9 mL/min (ref >60.00)
Glucose, Bld: 111 mg/dL — ABNORMAL HIGH (ref 70–99)
Potassium: 4.4 mEq/L (ref 3.5–5.1)
Sodium: 140 mEq/L (ref 135–145)
Total Bilirubin: 0.9 mg/dL (ref 0.2–1.2)
Total Protein: 6.7 g/dL (ref 6.0–8.3)

## 2018-06-12 LAB — LIPID PANEL
Cholesterol: 156 mg/dL (ref 0–200)
HDL: 42.1 mg/dL (ref >39.00)
LDL Cholesterol: 90 mg/dL (ref 0–99)
NonHDL: 113.5
Total CHOL/HDL Ratio: 4
Triglycerides: 119 mg/dL (ref 0.0–149.0)
VLDL: 23.8 mg/dL (ref 0.0–40.0)

## 2018-06-12 LAB — VITAMIN B12: Vitamin B-12: 132 pg/mL — ABNORMAL LOW (ref 211–911)

## 2018-06-12 NOTE — Telephone Encounter (Signed)
-----   Message from Lendon Collar, RT sent at 06/04/2018 11:34 AM EST ----- Regarding: Lab orders for Tuesday 06/12/18 Please enter CPE lab orders for 06/12/18. Thanks!

## 2018-06-20 ENCOUNTER — Ambulatory Visit (INDEPENDENT_AMBULATORY_CARE_PROVIDER_SITE_OTHER): Payer: BLUE CROSS/BLUE SHIELD | Admitting: Family Medicine

## 2018-06-20 ENCOUNTER — Encounter: Payer: Self-pay | Admitting: Family Medicine

## 2018-06-20 ENCOUNTER — Ambulatory Visit (INDEPENDENT_AMBULATORY_CARE_PROVIDER_SITE_OTHER)
Admission: RE | Admit: 2018-06-20 | Discharge: 2018-06-20 | Disposition: A | Discharge status: Home / Self Care | Payer: BLUE CROSS/BLUE SHIELD | Source: Ambulatory Visit | Attending: Family Medicine | Admitting: Family Medicine

## 2018-06-20 ENCOUNTER — Encounter: Payer: BLUE CROSS/BLUE SHIELD | Admitting: Family Medicine

## 2018-06-20 VITALS — BP 110/80 | HR 70 | Temp 98.2°F | Ht 65.0 in | Wt 172.5 lb

## 2018-06-20 DIAGNOSIS — Z0000 Encounter for general adult medical examination without abnormal findings: Secondary | ICD-10-CM

## 2018-06-20 DIAGNOSIS — R059 Cough, unspecified: Secondary | ICD-10-CM

## 2018-06-20 DIAGNOSIS — R739 Hyperglycemia, unspecified: Secondary | ICD-10-CM

## 2018-06-20 DIAGNOSIS — R05 Cough: Secondary | ICD-10-CM

## 2018-06-20 DIAGNOSIS — Z87891 Personal history of nicotine dependence: Secondary | ICD-10-CM | POA: Diagnosis not present

## 2018-06-20 LAB — HEMOGLOBIN A1C: Hgb A1c MFr Bld: 5.8 % (ref 4.6–6.5)

## 2018-06-20 NOTE — Progress Notes (Signed)
Dr. Frederico Hamman T. Merit Maybee, MD, [facility] Primary [facility] and Sports Medicine [place], [address] Phone: [phone] Fax: [phone]  06/20/2018  Patient: Roger Ferguson, MRN: [id number], DOB: [birth year], 52 y.o.  Primary Physician:  Owens Loffler, MD   Chief Complaint  Patient presents with  . Annual Exam   Subjective:   Roger Ferguson is a 52 y.o. pleasant patient who presents with the following:  Preventative Health Maintenance Visit:  Health Maintenance Summary Reviewed and updated, unless pt declines services.  Tobacco History Reviewed. Alcohol: No concerns, no excessive use Exercise Habits: Some activity, rec at least 30 mins 5 times a week - recently started some yoga STD concerns: no risk or activity to increase risk Drug Use: None Encouraged self-testicular check  Going up and down the breath, will feel like he is out of breath. Went and saw Dr. Einar Gip a couple of years ago.   15 years smoking, 1/2 ppd.  He has had some SOB Father d/c from lung cancer  Health Maintenance  Topic Date Due  . HIV Screening  08/27/1980  . TETANUS/TDAP  06/07/2020  . COLONOSCOPY  07/12/2027  . INFLUENZA VACCINE  Completed   Immunization History  Administered Date(s) Administered  . Influenza Split 03/25/2012  . Influenza Whole 04/24/2010  . Influenza,inj,Quad PF,6+ Mos 04/24/2014, 05/06/2015, 05/02/2016, 05/19/2017, 05/03/2018  . Influenza-Unspecified 03/25/2013  . Td 06/07/2010   Patient Active Problem List   Diagnosis Date Noted  . GERD (gastroesophageal reflux disease) 02/13/2013  . 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 B12 deficiency 03/30/2010  . Vitamin D deficiency 03/30/2010  . Backache 10/26/2009  . ANAL FISSURE 01/15/2009   Past Medical History:  Diagnosis Date  . Anal itching    chronic  . GERD (gastroesophageal reflux disease)   . History of hematuria    cystoscopy neg, rads eval  . Scapholunate dissociation 10/16/2011   Past  Surgical History:  Procedure Laterality Date  . LAPAROSCOPIC CHOLECYSTECTOMY    . RECTAL BOTOX INJECTION  09-2008   Social History   Socioeconomic History  . Marital status: Married    Spouse name: Not on file  . Number of children: 2  . Years of education: 12+  . Highest education level: Not on file  Occupational History  . Occupation: Sports coach: [employer]  . Financial resource strain: Not on file  . Food insecurity:    Worry: Not on file    Inability: Not on file  . Transportation needs:    Medical: Not on file    Non-medical: Not on file  Tobacco Use  . Smoking status: Former Research scientist (life sciences)  . Smokeless tobacco: Never Used  Substance and Sexual Activity  . Alcohol use: Yes    Alcohol/week: 0.0 standard drinks  . Drug use: No  . Sexual activity: Yes    Partners: Female  Lifestyle  . Physical activity:    Days per week: Not on file    Minutes per session: Not on file  . Stress: Not on file  Relationships  . Social connections:    Talks on phone: Not on file    Gets together: Not on file    Attends religious service: Not on file    Active member of club or organization: Not on file    Attends meetings of clubs or organizations: Not on file    Relationship status: Not on file  . Intimate partner violence:  Fear of current or ex partner: Not on file    Emotionally abused: Not on file    Physically abused: Not on file    Forced sexual activity: Not on file  Other Topics Concern  . Not on file  Social History Narrative   Regular exercise---no      Originally from [place] ([place]   Married with 2 children         Family History  Problem Relation Age of Onset  . Coronary artery disease Unknown 60   No Known Allergies  Medication list has been reviewed and updated.   General: Denies fever, chills, sweats. No significant weight loss. Eyes: Denies blurring,significant itching ENT: Denies earache, sore throat, and  hoarseness. Cardiovascular: Denies chest pains, palpitations, dyspnea on exertion Respiratory: Denies cough, dyspnea at rest,wheeezing Breast: no concerns about lumps GI: Denies nausea, vomiting, diarrhea, constipation, change in bowel habits, abdominal pain, melena, hematochezia GU: Denies penile discharge, ED, urinary flow / outflow problems. No STD concerns. Musculoskeletal: Denies back pain, joint pain Derm: Denies rash, itching Neuro: Denies  paresthesias, frequent falls, frequent headaches Psych: Denies depression, anxiety Endocrine: Denies cold intolerance, heat intolerance, polydipsia Heme: Denies enlarged lymph nodes Allergy: No hayfever  Objective:   BP 110/80   Pulse 70   Temp 98.2 F (36.8 C) (Oral)   Ht '5\' 5"'$  (1.651 m)   Wt 172 lb 8 oz (78.2 kg)   BMI 28.71 kg/m  Ideal Body Weight: Weight in (lb) to have BMI = 25: 149.9  No exam data present  GEN: well developed, well nourished, no acute distress Eyes: conjunctiva and lids normal, PERRLA, EOMI ENT: TM clear, nares clear, oral exam WNL Neck: supple, no lymphadenopathy, no thyromegaly, no JVD Pulm: clear to auscultation and percussion, respiratory effort normal CV: regular rate and rhythm, S1-S2, no murmur, rub or gallop, no bruits, peripheral pulses normal and symmetric, no cyanosis, clubbing, edema or varicosities GI: soft, non-tender; no hepatosplenomegaly, masses; active bowel sounds all quadrants GU: no hernia, testicular mass, penile discharge Lymph: no cervical, axillary or inguinal adenopathy MSK: gait normal, muscle tone and strength WNL, no joint swelling, effusions, discoloration, crepitus  SKIN: clear, good turgor, color WNL, no rashes, lesions, or ulcerations Neuro: normal mental status, normal strength, sensation, and motion Psych: alert; oriented to person, place and time, normally interactive and not anxious or depressed in appearance.  All labs reviewed with patient.  Lipids: Lab Results    Component Value Date   CHOL 156 06/12/2018   Lab Results  Component Value Date   HDL 42.10 06/12/2018   Lab Results  Component Value Date   LDLCALC 90 06/12/2018   Lab Results  Component Value Date   TRIG 119.0 06/12/2018   Lab Results  Component Value Date   CHOLHDL 4 06/12/2018   CBC: CBC Latest Ref Rng & Units 06/12/2018 06/12/2017 07/01/2016  WBC 4.0 - 10.5 K/uL 6.8 5.6 7.2  Hemoglobin 13.0 - 17.0 g/dL 15.0 14.9 15.2  Hematocrit 39.0 - 52.0 % 44.6 46.0 45.2  Platelets 150.0 - 400.0 K/uL 235.0 265.0 381.0    Basic Metabolic Panel:    Component Value Date/Time   NA 140 06/12/2018 0845   K 4.4 06/12/2018 0845   CL 103 06/12/2018 0845   CO2 31 06/12/2018 0845   BUN 14 06/12/2018 0845   CREATININE 1.07 06/12/2018 0845   CREATININE 0.96 07/02/2013 1739   GLUCOSE 111 (H) 06/12/2018 0845   CALCIUM 9.6 06/12/2018 0845   Hepatic Function Latest  Ref Rng & Units 06/12/2018 06/12/2017 07/01/2016  Total Protein 6.0 - 8.3 g/dL 6.7 6.6 6.9  Albumin 3.5 - 5.2 g/dL 4.4 4.4 4.3  AST 0 - 37 U/L '14 16 14  '$ ALT 0 - 53 U/L '9 8 8  '$ Alk Phosphatase 39 - 117 U/L 54 52 59  Total Bilirubin 0.2 - 1.2 mg/dL 0.9 1.5(H) 0.7  Bilirubin, Direct 0.0 - 0.3 mg/dL - 0.2 0.1    Lab Results  Component Value Date   TSH 2.34 07/01/2016   Lab Results  Component Value Date   PSA 0.94 06/12/2017   PSA 1.27 07/01/2016   PSA 1.02 07/01/2015   Dg Chest 2 View  Result Date: 06/20/2018 CLINICAL DATA:  Cough for several weeks EXAM: CHEST - 2 VIEW COMPARISON:  January 21, 2016 FINDINGS: No edema or consolidation. The heart size and pulmonary vascularity are normal. No adenopathy. No bone lesions. IMPRESSION: No edema or consolidation. Electronically Signed   By: Lowella Grip III M.D.   On: 06/20/2018 13:52     Assessment and Plan:   Healthcare maintenance - Plan: PSA, total and free  Hyperglycemia - Plan: Hemoglobin A1c  Cough - Plan: DG Chest 2 View  Former smoker - Plan: DG Chest 2  View  Doing ok overall WIth smoking history, check cxr for cough - negative  Mildly elevated BS, check a1c PSA today  Health Maintenance Exam: The patient's preventative maintenance and recommended screening tests for an annual wellness exam were reviewed in full today. Brought up to date unless services declined.  Counselled on the importance of diet, exercise, and its role in overall health and mortality. The patient's FH and SH was reviewed, including their home life, tobacco status, and drug and alcohol status.  Follow-up in 1 year for physical exam or additional follow-up below.  Follow-up: No follow-ups on file. Or follow-up in 1 year if not noted.  Signed,  Maud Deed. Theodora Lalanne, MD   Allergies as of 06/20/2018   No Known Allergies     Medication List        Accurate as of 06/20/18  8:54 AM. Always use your most recent med list.          acyclovir 200 MG capsule Commonly known as:  ZOVIRAX Take 1 capsule (200 mg total) by mouth 5 (five) times daily.   clotrimazole 1 % cream Commonly known as:  LOTRIMIN Apply 1 application topically 2 (two) times daily.   dexlansoprazole 60 MG capsule Commonly known as:  DEXILANT Take 1 capsule (60 mg total) by mouth daily.   diclofenac 75 MG EC tablet Commonly known as:  VOLTAREN Take 1 tablet (75 mg total) by mouth 2 (two) times daily. Prn joint pain   fluticasone 0.005 % ointment Commonly known as:  CUTIVATE Apply 1 application topically 2 (two) times daily.   hydrocortisone 2.5 % ointment Apply topically 2 (two) times daily as needed. No more than 7 days in a row.   methocarbamol 500 MG tablet Commonly known as:  ROBAXIN Take 1 tablet (500 mg total) by mouth 3 (three) times daily as needed for muscle spasms.   triamcinolone cream 0.1 % Commonly known as:  KENALOG Apply 1 application topically 2 (two) times daily.   vitamin B-12 1000 MCG tablet Commonly known as:  CYANOCOBALAMIN Take 2 tablets (2,000 mcg  total) by mouth daily.

## 2018-06-21 ENCOUNTER — Encounter: Payer: Self-pay | Admitting: Family Medicine

## 2018-06-21 LAB — PSA, TOTAL AND FREE
PSA, % FREE: 42 % (ref >25)
PSA, FREE: 0.5 ng/mL
PSA, Total: 1.2 ng/mL (ref <=4.0)

## 2018-08-07 ENCOUNTER — Telehealth: Payer: Self-pay | Admitting: Family Medicine

## 2018-08-07 NOTE — Telephone Encounter (Signed)
I do not feel Tamiflu is appropriate given no known exposure and asymptomatic.

## 2018-08-07 NOTE — Telephone Encounter (Signed)
Patient is traveling to [place] tomorrow. Patient wants to know if he can have a rx for Tamiflu called in to [facility].  Patient said he's not having any symptoms, but is concerned about the Caronavirus.

## 2018-08-07 NOTE — Telephone Encounter (Signed)
Roger Ferguson notified as instructed by telephone.  He still would like to get a Rx for Tamiflu just to have on hand in case he does develop flu symptoms while traveling to [place].  His flight leaves at 2 am.  He states a colleague traveling with him's doctor recommended that he take tamiflu with them.   Will forward to Dr. Diona Browner to see if she is willing to send in a prescription for Roger Ferguson since I am not sure if Dr. Lorelei Pont will be checking his messages anymore today.

## 2018-08-07 NOTE — Telephone Encounter (Signed)
Patient advised as below. Advised patient that Dr. Lorelei Pont may see this message before patient leaves out of the country at 2 am and have a different response and send RX in, we will not be able to call him about it but he can check his pharmacy before he leaves to see if a RX has been sent in. Patient expressed understanding.

## 2018-08-07 NOTE — Telephone Encounter (Signed)
There are no antivirals for the new coronavirus.   Tamiflu is a treatment for influenza.

## 2018-08-08 NOTE — Telephone Encounter (Signed)
Currently without symptoms, but at this point he is already on his plane.

## 2018-08-29 DIAGNOSIS — H00015 Hordeolum externum left lower eyelid: Secondary | ICD-10-CM | POA: Diagnosis not present

## 2018-10-11 ENCOUNTER — Telehealth: Payer: Self-pay

## 2018-10-11 NOTE — Telephone Encounter (Signed)
reasonable 

## 2018-10-11 NOTE — Telephone Encounter (Signed)
Pt left v/m requesting cb; I spoke with pt;pt started with S/T or dry throat and the pt ? If uvula is sore also; pt gargled with salt water. Throat feels better after gargled.Throat hurts on and off and his ear is slightly bothered due to S/T; ear is not itchy or hurting. Pt cannot describe how ear feels. Pt said he thinks he is more conscious of all symptoms due to concern of covid virus. Very slight prod cough with little bit of white mucus for 2 days.(? allergies) No fever, 98.2. No SOB but then pt said he has some SOB when climbs steps.pt is not sure when SOB upon exertion started. Pt has not traveled and has no known exposure to positive corona virus or flu. 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 Pt may have passed by that person in the plant at some point. Pt did not want a virtual visit at this time; if symptoms persist pt will call [facility] for webex on 10/12/18. ED precautions given; pt voiced understanding.FYI to Dr Lorelei Pont.

## 2018-11-28 ENCOUNTER — Other Ambulatory Visit: Payer: Self-pay | Admitting: Family Medicine

## 2018-11-28 NOTE — Telephone Encounter (Signed)
Last office visit 06/20/2018 for CPE.  Last refilled Diclofenac 07/26/2017 for #90 with 1 refill.  Methacarbamol 07/26/2017 for #90 with 1 refill.  No future appointments.

## 2019-01-10 IMAGING — DX DG CHEST 2V
2 series · 2 of 2 positions shown · non-contrast
Comparison: January 21, 2016

CLINICAL DATA: Cough for several weeks

EXAM:
CHEST - 2 VIEW

[chest pa]
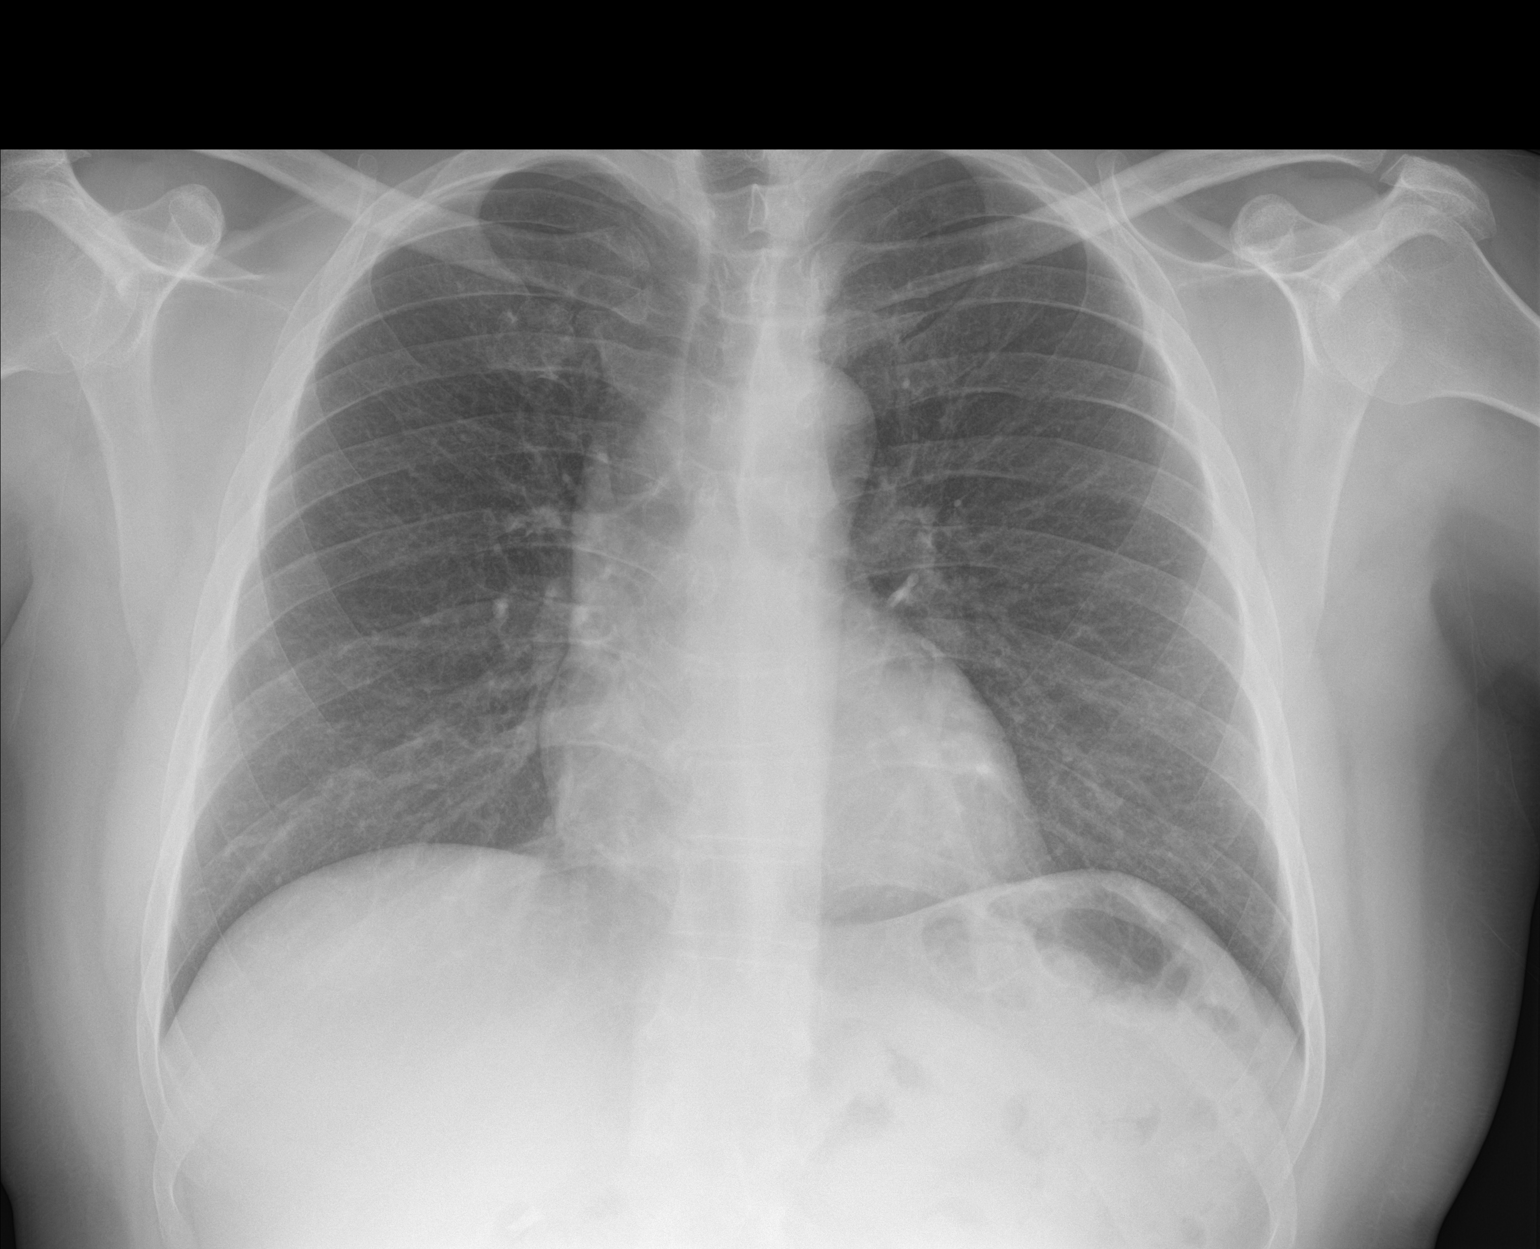

[chest lat]
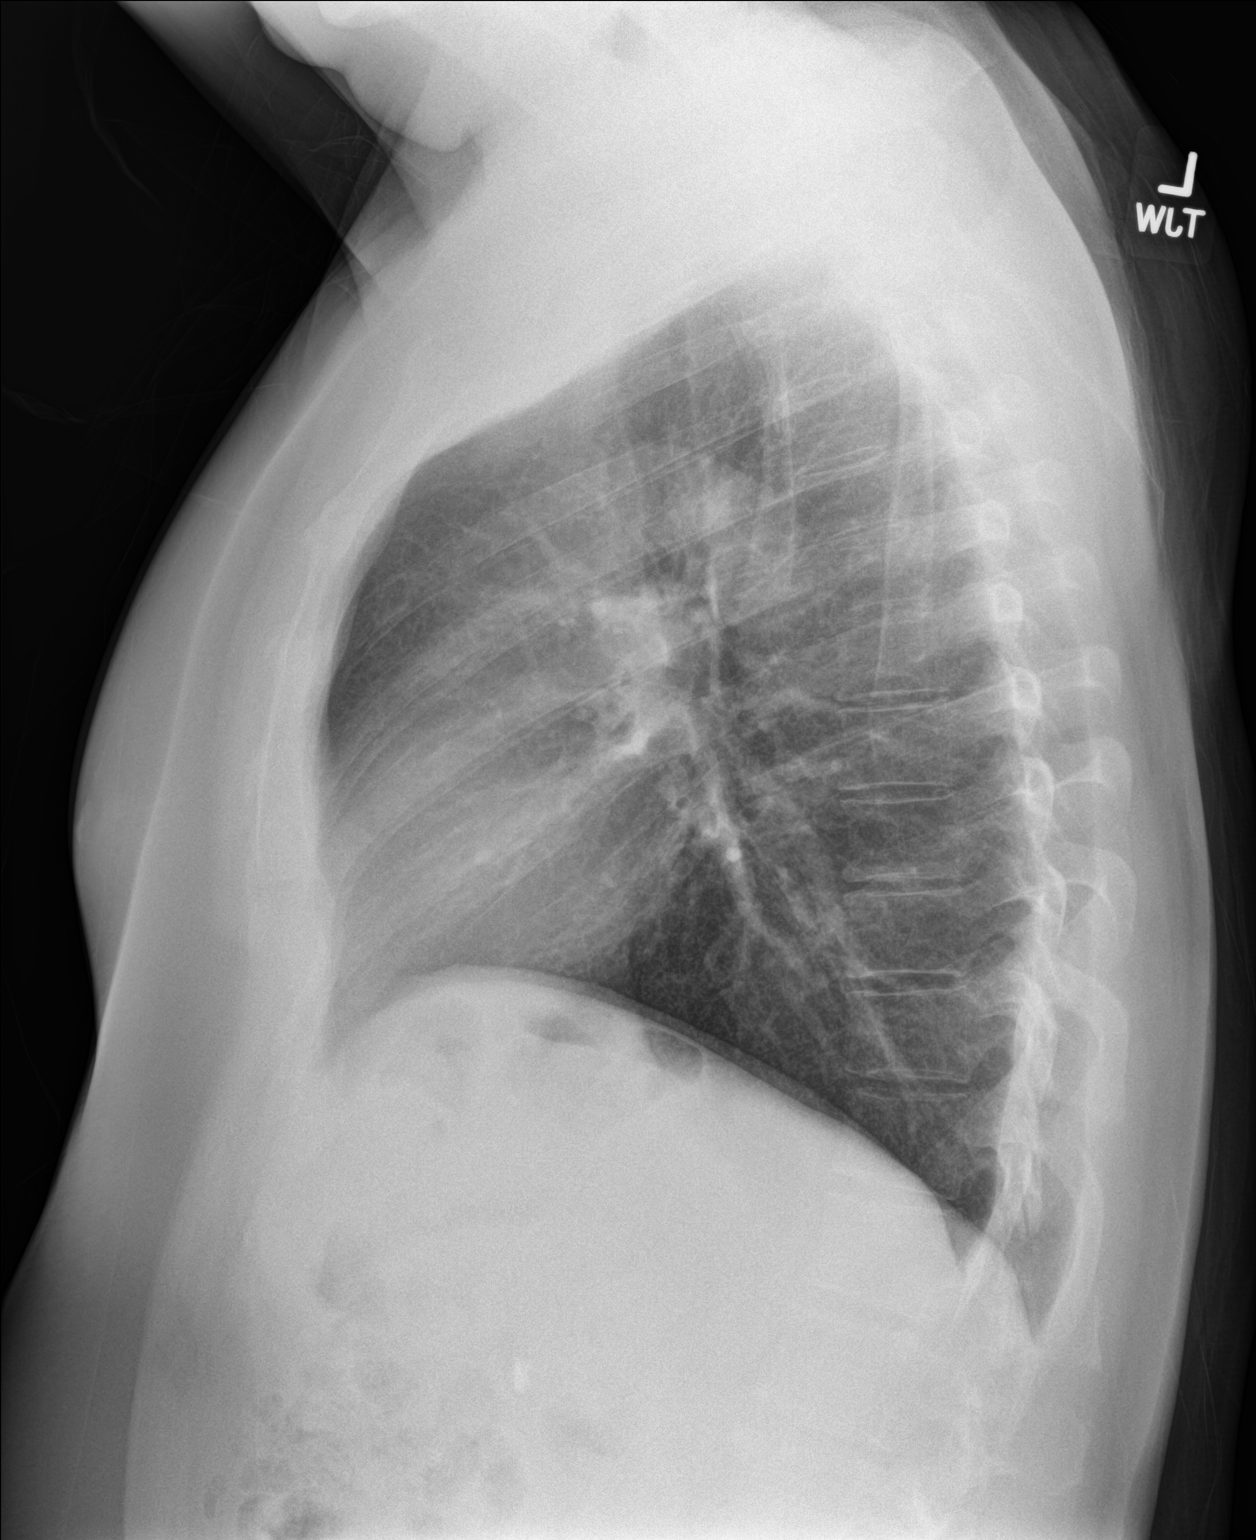

[2 of 2 positions shown; findings below may reference images not displayed]

FINDINGS: No edema or consolidation. The heart size and pulmonary vascularity
are normal. No adenopathy. No bone lesions.
IMPRESSION: No edema or consolidation.

## 2019-03-04 ENCOUNTER — Ambulatory Visit (HOSPITAL_COMMUNITY)
Admission: RE | Admit: 2019-03-04 | Discharge: 2019-03-04 | Disposition: A | Discharge status: Home / Self Care | Payer: 59 | Source: Ambulatory Visit | Attending: Family Medicine | Admitting: Family Medicine

## 2019-03-04 ENCOUNTER — Ambulatory Visit: Payer: 59 | Admitting: Family Medicine

## 2019-03-04 ENCOUNTER — Other Ambulatory Visit: Payer: Self-pay

## 2019-03-04 ENCOUNTER — Ambulatory Visit (INDEPENDENT_AMBULATORY_CARE_PROVIDER_SITE_OTHER)
Admission: RE | Admit: 2019-03-04 | Discharge: 2019-03-04 | Disposition: A | Discharge status: Home / Self Care | Payer: 59 | Source: Ambulatory Visit | Attending: Family Medicine | Admitting: Family Medicine

## 2019-03-04 ENCOUNTER — Ambulatory Visit: Payer: Self-pay | Admitting: *Deleted

## 2019-03-04 ENCOUNTER — Encounter: Payer: Self-pay | Admitting: Family Medicine

## 2019-03-04 VITALS — BP 120/78 | HR 69 | Temp 98.3°F | Ht 65.0 in | Wt 172.5 lb

## 2019-03-04 DIAGNOSIS — R0602 Shortness of breath: Secondary | ICD-10-CM

## 2019-03-04 DIAGNOSIS — R61 Generalized hyperhidrosis: Secondary | ICD-10-CM | POA: Diagnosis not present

## 2019-03-04 DIAGNOSIS — R209 Unspecified disturbances of skin sensation: Secondary | ICD-10-CM

## 2019-03-04 DIAGNOSIS — R231 Pallor: Secondary | ICD-10-CM | POA: Diagnosis not present

## 2019-03-04 DIAGNOSIS — R7989 Other specified abnormal findings of blood chemistry: Secondary | ICD-10-CM | POA: Insufficient documentation

## 2019-03-04 LAB — HEPATIC FUNCTION PANEL
ALT: 8 U/L (ref 0–53)
AST: 16 U/L (ref 0–37)
Albumin: 4.6 g/dL (ref 3.5–5.2)
Alkaline Phosphatase: 50 U/L (ref 39–117)
Bilirubin, Direct: 0.2 mg/dL (ref 0.0–0.3)
Total Bilirubin: 1 mg/dL (ref 0.2–1.2)
Total Protein: 7.1 g/dL (ref 6.0–8.3)

## 2019-03-04 LAB — CBC WITH DIFFERENTIAL/PLATELET
Basophils Absolute: 0 10*3/uL (ref 0.0–0.1)
Basophils Relative: 0.8 % (ref 0.0–3.0)
Eosinophils Absolute: 0.1 10*3/uL (ref 0.0–0.7)
Eosinophils Relative: 1.5 % (ref 0.0–5.0)
HCT: 45.5 % (ref 39.0–52.0)
Hemoglobin: 14.8 g/dL (ref 13.0–17.0)
Lymphocytes Relative: 30 % (ref 12.0–46.0)
Lymphs Abs: 1.6 10*3/uL (ref 0.7–4.0)
MCHC: 32.6 g/dL (ref 30.0–36.0)
MCV: 87.6 fl (ref 78.0–100.0)
Monocytes Absolute: 0.3 10*3/uL (ref 0.1–1.0)
Monocytes Relative: 6.5 % (ref 3.0–12.0)
Neutro Abs: 3.3 10*3/uL (ref 1.4–7.7)
Neutrophils Relative %: 61.2 % (ref 43.0–77.0)
Platelets: 243 10*3/uL (ref 150.0–400.0)
RBC: 5.2 Mil/uL (ref 4.22–5.81)
RDW: 13.4 % (ref 11.5–15.5)
WBC: 5.3 10*3/uL (ref 4.0–10.5)

## 2019-03-04 LAB — BASIC METABOLIC PANEL
BUN: 11 mg/dL (ref 6–23)
CO2: 30 mEq/L (ref 19–32)
Calcium: 9.5 mg/dL (ref 8.4–10.5)
Chloride: 103 mEq/L (ref 96–112)
Creatinine, Ser: 1 mg/dL (ref 0.40–1.50)
GFR: 78.01 mL/min (ref >60.00)
Glucose, Bld: 116 mg/dL — ABNORMAL HIGH (ref 70–99)
Potassium: 4.2 mEq/L (ref 3.5–5.1)
Sodium: 139 mEq/L (ref 135–145)

## 2019-03-04 LAB — D-DIMER, QUANTITATIVE: D-Dimer, Quant: 0.51 mcg/mL FEU — ABNORMAL HIGH (ref <0.50)

## 2019-03-04 LAB — TROPONIN I: Troponin I: <0.01 ng/mL (ref <=0.0)

## 2019-03-04 LAB — HEMOGLOBIN A1C: Hgb A1c MFr Bld: 5.9 % (ref 4.6–6.5)

## 2019-03-04 MED ORDER — IOHEXOL 350 MG/ML SOLN
75.0000 mL | Freq: Once | INTRAVENOUS | Status: AC | PRN
  Administered 2019-03-04: 75 mL via INTRAVENOUS

## 2019-03-04 NOTE — Telephone Encounter (Signed)
I spoke with pt Pt has no covid symptoms other than SOB; no travel and no known exposure to + covid.2 wks on and off SOB upon exertion;last time SOB was last night; BP 140/91 last night; after resting BP 120/80. T this morning was 97.7. last time with episode of sweating was this morning around 6AM. Pt is feeling uneasy;pt cannot describe uneasiness in any greater detail; pt has no CP, tightness, pressure in chest;no neck or jaw pain. No N&V and no H/A or dizziness. Pt said saw cardiologist about 1 month ago due to family hx of heart disease.Butch Penny CMA advised pt can have in office visit today at 12 noon. Pt is at work so last BP taken is listed above. ED precautions given.

## 2019-03-04 NOTE — Addendum Note (Signed)
Addended by: Owens Loffler on: 03/04/2019 04:01 PM   Modules accepted: Orders

## 2019-03-04 NOTE — Progress Notes (Addendum)
Roger Ferguson T. Viha Kriegel, MD Primary Care and National at [facility] [place], [address] Phone: [phone]  FAX: [phone]  Jason Marshall - 53 y.o. male  MRN [id number]  Date of Birth: 1965/11/09  Visit Date: 03/04/2019  PCP: Owens Loffler, MD  Referred by: Owens Loffler, MD  Chief Complaint  Patient presents with  . Shortness of Breath  . Excessive Sweating  . Dry Mouth  . Uneasiness   Subjective:   Steffin Smedley is a 53 y.o. very pleasant male patient who presents with the following:  98% 02  Out of breath with going up a flight of stairs.  This morning has felt a little uneasy.  With yoga and sweaty a lot.  ? 1 drink the night before. Uneasy with Yoga.  Sweating and uneasy this morning. Went directly to the shower.  Went to work and felt chills, uneasy, and SOB.  No fever or respiratory symptoms.    He does have left sided shoulder pain. No chest pain.  Left sided shoulder pain.  3-4 weeks with the shoulder pain.   No SOB or chest pain right now.  Treadmill stress normal  No catch  Cards, [facility]: Dr. Einar Gip 2017  Father has heart problems, early 5's Mom of heart attack Rare smoking Rare ETOH No drugs  Past Medical History, Surgical History, Social History, Family History, Problem List, Medications, and Allergies have been reviewed and updated if relevant.  Patient Active Problem List   Diagnosis Date Noted  . GERD (gastroesophageal reflux disease) 02/13/2013  . 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 B12 deficiency 03/30/2010  . Vitamin D deficiency 03/30/2010  . Backache 10/26/2009  . ANAL FISSURE 01/15/2009    Past Medical History:  Diagnosis Date  . Anal itching    chronic  . GERD (gastroesophageal reflux disease)   . History of hematuria    cystoscopy neg, rads eval  . 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 LAPAROSCOPIC  CHOLECYSTECTOMY    . RECTAL BOTOX INJECTION  09-2008    Social History   Socioeconomic History  . Marital status: Married    Spouse name: Not on file  . Number of children: 2  . Years of education: 12+  . Highest education level: Not on file  Occupational History  . Occupation: Sports coach: [employer]  . Financial resource strain: Not on file  . Food insecurity    Worry: Not on file    Inability: Not on file  . Transportation needs    Medical: Not on file    Non-medical: Not on file  Tobacco Use  . Smoking status: Former Research scientist (life sciences)  . Smokeless tobacco: Never Used  Substance and Sexual Activity  . Alcohol use: Yes    Alcohol/week: 0.0 standard drinks  . Drug use: No  . Sexual activity: Yes    Partners: Female  Lifestyle  . Physical activity    Days per week: Not on file    Minutes per session: Not on file  . Stress: Not on file  Relationships  . Social Herbalist on phone: Not on file    Gets together: Not on file    Attends religious service: Not on file    Active member of club or organization: Not on file    Attends meetings of clubs or organizations: Not on file  Relationship status: Not on file  . Intimate partner violence    Fear of current or ex partner: Not on file    Emotionally abused: Not on file    Physically abused: Not on file    Forced sexual activity: Not on file  Other Topics Concern  . Not on file  Social History Narrative   Regular exercise---no      Originally from [place] ([place]   Married with 2 children          Family History  Problem Relation Age of Onset  . Coronary artery disease Unknown 60    No Known Allergies  Medication list reviewed and updated in full in [place].   GEN: No acute illnesses, no fevers, chills. GI: No n/v/d, eating normally Pulm: + SOB Interactive and getting along well at home.  Otherwise, ROS is as per the HPI.  Objective:   BP  120/78   Pulse 69   Temp 98.3 F (36.8 C) (Temporal)   Ht 5\' 5"  (1.651 m)   Wt 172 lb 8 oz (78.2 kg)   SpO2 98%   BMI 28.71 kg/m   GEN: WDWN, NAD, Non-toxic, A & O x 3 HEENT: Atraumatic, Normocephalic. Neck supple. No masses, No LAD. Ears and Nose: No external deformity. CV: RRR, No M/G/R. No JVD. No thrill. No extra heart sounds. PULM: CTA B, no wheezes, crackles, rhonchi. No retractions. No resp. distress. No accessory muscle use. EXTR: No c/c/e NEURO Normal gait.  PSYCH: Normally interactive. Conversant. Not depressed or anxious appearing.  Calm demeanor.   Laboratory and Imaging Data:  Assessment and Plan:     ICD-10-CM   1. SOB (shortness of breath)  R06.02 EKG [id number]    Basic metabolic panel    CBC with Differential/Platelet    Hepatic function panel    Hemoglobin A1c    D-dimer, quantitative (not at [facility])    DG Chest 2 View    Troponin I    CT Angio Chest W/Cm &/Or Wo Cm    CANCELED: Troponin T  2. Cold and clammy skin  [id number] Basic metabolic panel    CBC with Differential/Platelet    Hepatic function panel    Hemoglobin A1c    D-dimer, quantitative (not at [facility])    Troponin I    CANCELED: Troponin T  3. Excessive sweating  [id number] Basic metabolic panel    CBC with Differential/Platelet    Hepatic function panel    Hemoglobin A1c    D-dimer, quantitative (not at [facility])    Troponin I    CANCELED: Troponin T  4. Positive D-dimer  R79.89 CT Angio Chest W/Cm &/Or Wo Cm   EKG: Normal sinus rhythm. Normal axis, normal R wave progression, No acute ST elevation or depression.   With ongoing SOB, check all labs above with appropriate f/u.  If d-dimer is positive he will need CTA and if top + to ER.  Addendum, 03/04/19 4:09 PM  D-dimer is positive, arrange for a CTA of the chest to rule out PE.  Follow-up: No follow-ups on file.  No orders of the defined types were placed in this encounter.  Orders Placed This Encounter  Procedures  . DG Chest 2 View  . CT  Angio Chest W/Cm &/Or Wo Cm  . Basic metabolic panel  . CBC with Differential/Platelet  . Hepatic function panel  . Hemoglobin A1c  . D-dimer, quantitative (not at [facility])  . Troponin I  . EKG 12-Lead  Signed,  Maud Deed. Sharonann Malbrough, MD   Outpatient Encounter Medications as of 03/04/2019  Medication Sig  . dexlansoprazole (DEXILANT) 60 MG capsule Take 1 capsule (60 mg total) by mouth daily.  . diclofenac (VOLTAREN) 75 MG EC tablet TAKE 1 TABLET BY MOUTH 2 TIMES DAILY AS NEEDED JOINT PAIN  . fluticasone (CUTIVATE) 0.005 % ointment Apply 1 application topically 2 (two) times daily.  . hydrocortisone 2.5 % ointment Apply topically 2 (two) times daily as needed. No more than 7 days in a row.  . methocarbamol (ROBAXIN) 500 MG tablet TAKE 1 TABLET BY MOUTH 3 TIMES DAILY AS NEEDED FOR MUSCLE SPASMS.  Marland Kitchen triamcinolone cream (KENALOG) 0.1 % Apply 1 application topically 2 (two) times daily.  . vitamin B-12 (CYANOCOBALAMIN) 1000 MCG tablet Take 2 tablets (2,000 mcg total) by mouth daily.  . [DISCONTINUED] acyclovir (ZOVIRAX) 200 MG capsule Take 1 capsule (200 mg total) by mouth 5 (five) times daily.  . [DISCONTINUED] clotrimazole (LOTRIMIN) 1 % cream Apply 1 application topically 2 (two) times daily.   No facility-administered encounter medications on file as of 03/04/2019.

## 2019-03-04 NOTE — Telephone Encounter (Signed)
Malinda RN also noted Okay to schedule same day appointment per Shirlean Mylar. Patient expecting covid screen call prior to appointment. Use work number on chart.

## 2019-03-04 NOTE — Telephone Encounter (Signed)
Noticed he gets winded with walking upstairs yesterday and again this morning. Along with feeling sweaty, not noticeably dripping with sweat just clammy all over. No CP/cough/fever/chills/congestion. Voiding normally, mouth feels dry drinking 4-5 cups water daily. Denies nausea/vomiting/pain. "just doesn't feel right". No fever/No travels/No known exposures to [phone]. Reviewed urgent symptoms requiring immediate evaluation. Stated he understood.   Answer Assessment - Initial Assessment Questions 1. ONSET: "When did the sweating start?"      Yesterday then again just awhile ago this morning. 2. LOCATION: "What part of your body has excessive sweating?" (e.g., entire body; just face, underarms, palms, or soles of feet).      All over 3. SEVERITY: "How bad is the sweating?"    (Scale 1-10; or mild, moderate, severe)   -  MILD (1-3): doesn't interfere with normal activities    -  MODERATE (4-7): interferes with normal activities (e.g., work or school) or awakens from sleep; causes embarrassment in social situations    -  SEVERE (8-10): drenching sweats and has to change bed clothes or bed linens.     mild 4. CAUSE: "What do you think is causing the sweating?"     unsure 5. FEVER: "Have you been having fevers?"     no 6. OTHER SYMPTOMS: "Do you have any other symptoms?" (e.g., chest pain, difficulty breathing, lightheadedness, weight loss)     Feels some SOB with walking up stairs.  Protocols used: [facility]

## 2019-04-04 ENCOUNTER — Ambulatory Visit (INDEPENDENT_AMBULATORY_CARE_PROVIDER_SITE_OTHER): Payer: 59

## 2019-04-04 DIAGNOSIS — Z23 Encounter for immunization: Secondary | ICD-10-CM | POA: Diagnosis not present

## 2019-05-14 ENCOUNTER — Encounter: Payer: Self-pay | Admitting: Family Medicine

## 2019-05-14 ENCOUNTER — Other Ambulatory Visit: Payer: Self-pay

## 2019-05-14 ENCOUNTER — Ambulatory Visit: Payer: 59 | Admitting: Family Medicine

## 2019-05-14 VITALS — BP 134/88 | HR 78 | Temp 97.1°F | Resp 14 | Ht 65.0 in | Wt 174.0 lb

## 2019-05-14 DIAGNOSIS — L259 Unspecified contact dermatitis, unspecified cause: Secondary | ICD-10-CM

## 2019-05-14 MED ORDER — TRIAMCINOLONE ACETONIDE 0.1 % EX CREA: 1.0000 "application " | TOPICAL_CREAM | Freq: Two times a day (BID) | CUTANEOUS | 0 refills | Status: DC

## 2019-05-14 NOTE — Progress Notes (Signed)
   Subjective:     Roger Ferguson is a 53 y.o. male presenting for Rash ( appeared around 05/09/2019. some in the abdomen area and the back. applied Hydrocortisone oitment and used Benadryl)     Rash This is a new problem. The current episode started in the past 7 days. The affected locations include the abdomen and back. The rash is characterized by itchiness and redness. He was exposed to a new detergent/soap. Pertinent negatives include no cough, diarrhea, fever, shortness of breath or vomiting. Past treatments include antihistamine and topical steroids. The treatment provided mild relief. There is no history of allergies, asthma or eczema.       Review of Systems  Constitutional: Negative for fever.  Respiratory: Negative for cough and shortness of breath.   Gastrointestinal: Negative for diarrhea and vomiting.  Skin: Positive for rash.     Social History   Tobacco Use  Smoking Status Former Smoker  Smokeless Tobacco Never Used        Objective:    BP Readings from Last 3 Encounters:  05/14/19 134/88  03/04/19 120/78  06/20/18 110/80   Wt Readings from Last 3 Encounters:  05/14/19 174 lb (78.9 kg)  03/04/19 172 lb 8 oz (78.2 kg)  06/20/18 172 lb 8 oz (78.2 kg)    BP 134/88   Pulse 78   Temp (!) 97.1 F (36.2 C)   Resp 14   Ht 5\' 5"  (1.651 m)   Wt 174 lb (78.9 kg)   BMI 28.96 kg/m    Physical Exam Constitutional:      Appearance: Normal appearance. He is not ill-appearing or diaphoretic.  HENT:     Right Ear: External ear normal.     Left Ear: External ear normal.     Nose: Nose normal.  Eyes:     General: No scleral icterus.    Extraocular Movements: Extraocular movements intact.     Conjunctiva/sclera: Conjunctivae normal.  Neck:     Musculoskeletal: Neck supple.  Cardiovascular:     Rate and Rhythm: Normal rate.  Pulmonary:     Effort: Pulmonary effort is normal.  Skin:    General: Skin is warm and dry.     Comments: Erythematous  macular/papular rash with some raised lesions following scratch lines. A few healing blister lesions  Neurological:     Mental Status: He is alert. Mental status is at baseline.  Psychiatric:        Mood and Affect: Mood normal.           Assessment & Plan:   Problem List Items Addressed This Visit    None    Visit Diagnoses    Contact dermatitis, unspecified contact dermatitis type, unspecified trigger    -  Primary   Relevant Medications   triamcinolone cream (KENALOG) 0.1 %     Patient Instructions  # Dermatitis  -- I suspect this may be an allergic reaction to something -- If new soap - go back to old one and rewash any clothes  Take allegra in the morning Benadryl as need up to every 6 hours  Use Triamcinolone cream - twice a day  If rash is worsening or not improving call us back.     Return if symptoms worsen or fail to improve.  Lesleigh Noe, MD

## 2019-05-14 NOTE — Patient Instructions (Signed)
#   Dermatitis  -- I suspect this may be an allergic reaction to something -- If new soap - go back to old one and rewash any clothes  Take allegra in the morning Benadryl as need up to every 6 hours  Use Triamcinolone cream - twice a day  If rash is worsening or not improving call us back.

## 2019-05-17 ENCOUNTER — Telehealth: Payer: Self-pay

## 2019-05-17 ENCOUNTER — Other Ambulatory Visit: Payer: Self-pay | Admitting: Family Medicine

## 2019-05-17 DIAGNOSIS — L259 Unspecified contact dermatitis, unspecified cause: Secondary | ICD-10-CM

## 2019-05-17 MED ORDER — PREDNISONE 10 MG PO TABS: 10.0000 mg | ORAL_TABLET | Freq: Every day | ORAL | 0 refills | Status: DC

## 2019-05-17 NOTE — Telephone Encounter (Signed)
Patient was seen in our office on 05/14/19 for a possible allergic reaction /dermatitis, and was prescribed triamcinolone cream. Patient states that the reaction has gotten worse, and the benadryl, as well as the cream is not helping. Patient is wondering if there is something else he can take, or if he needs to be seen again?

## 2019-05-17 NOTE — Telephone Encounter (Signed)
Patient advised and verbalized understanding 

## 2019-05-17 NOTE — Telephone Encounter (Signed)
Appreciate the help

## 2019-05-17 NOTE — Telephone Encounter (Signed)
Patient called back about this. Dr Einar Pheasant saw patient for this but she is not here today and Dr Lorelei Pont is not here also. Patient asks for someone to help him today before the weekend. Re sending this to Jackelyn Poling to see if she can help. Thank you

## 2019-05-17 NOTE — Telephone Encounter (Signed)
Please call patient and tell him that I have sent in prednisone for him to take daily. Needs to take food and a full glass of water. Gradually move dose to morning with breakfast. If not improved by Monday, will need follow up appointment. If any fever, SOB, vomiting over weekend, go to UC/ER.

## 2019-05-24 ENCOUNTER — Encounter: Payer: Self-pay | Admitting: Family Medicine

## 2019-05-24 ENCOUNTER — Telehealth: Payer: Self-pay

## 2019-05-24 ENCOUNTER — Ambulatory Visit (INDEPENDENT_AMBULATORY_CARE_PROVIDER_SITE_OTHER): Payer: 59 | Admitting: Family Medicine

## 2019-05-24 VITALS — Ht 65.0 in | Wt 174.0 lb

## 2019-05-24 DIAGNOSIS — R21 Rash and other nonspecific skin eruption: Secondary | ICD-10-CM

## 2019-05-24 NOTE — Telephone Encounter (Signed)
See mychart message. Virtual appointment made for 2 pm

## 2019-05-24 NOTE — Telephone Encounter (Signed)
Patient called stating that his rash is not resolving. He started Prednisone last Friday-05/17/2019 and today is the last day of him taking this medication. Patient states that his rash is in the abdominal area, back and sides. Abdomen area is a little better but back and sides are more pronounced now than they were before. He wakes up at 3 or 4 am with itching. Itching sensation overall is not better. He is also taking Zyrtec in the morning and benadryl in the evening. Some days he does feel better with itching around 2 to 3 pm in the afternoon but then around 6 pm itching gets severe again. Patient said he would send [place] pictures of the rash now through mychart. What should patient do now? Please review. Thank you

## 2019-05-24 NOTE — Telephone Encounter (Signed)
Please call patient and see if he can come in for in person visit.

## 2019-05-24 NOTE — Progress Notes (Signed)
Virtual Visit via Video Note  I connected with Roger Ferguson on 05/24/19 at  2:00 PM EST by a video enabled telemedicine application and verified that I am speaking with the correct person using two identifiers.  Location: Patient: at his home Provider: [place], also on call was Kathe Becton, NP student, permission granted by patient for her to listen to call.    I discussed the limitations of evaluation and management by telemedicine and the availability of in person appointments. The patient expressed understanding and agreed to proceed.  History of Present Illness: Chief Complaint  Patient presents with  . Rash    not resolving ( see telephone note for today ) Last day of Prednisone today. Patient states that his rash is in the abdominal area, back and sides. Abdomen area is a little better but back and sides are more pronounced now than they were before.   This is a 53 yo male who presents today with above cc. He was seen 05/21/19 with ?contact dermatitis. Was given triamcinolone cream and called back 3 days later without improvement. I took the phone message and sent him a course of prednisone. The patient reports that some areas are worse; abdomen a little better. He had been using a different soap, but one he has previously used, he switched back to his old soap and laundered all of his clothes and bedding. He denies any lesions on hands, between fingers. No fever/chills, no myalgias/arthralgias. Has not seen any fluid filled or hive type lesions. Has been taking cetirizine daily and requiring diphenhydramine at bedtime and again in the middle of the night. Does not itch much during the day while working, notices it more when he comes home and during the night. Not exacerbated by hot showers.   Past Medical History:  Diagnosis Date  . Anal itching    chronic  . GERD (gastroesophageal reflux disease)   . History of hematuria    cystoscopy neg, rads eval  . 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 LAPAROSCOPIC CHOLECYSTECTOMY    . RECTAL BOTOX INJECTION  09-2008   Family History  Problem Relation Age of Onset  . Coronary artery disease Unknown 47   Social History   Tobacco Use  . Smoking status: Former Research scientist (life sciences)  . Smokeless tobacco: Never Used  Substance Use Topics  . Alcohol use: Yes    Alcohol/week: 0.0 standard drinks  . Drug use: No      Observations/Objective: The patient is alert and answers questions appropriately. Respirations even and unlabored, no increased work of breathing. No audible wheeze or witnessed cough. There are pictures of rash under media tab that have been reviewed which demonstrate an erythematous, raised rash on abdomen and sides. Some lesions slightly darker (? Resolving). Scattered areas excoriation/scabbing from scratching. No drainage or pustules.   Ht 5\' 5"  (1.651 m)   Wt 174 lb (78.9 kg)   BMI 28.96 kg/m  Wt Readings from Last 3 Encounters:  05/24/19 174 lb (78.9 kg)  05/14/19 174 lb (78.9 kg)  03/04/19 172 lb 8 oz (78.2 kg)    Assessment and Plan: 1. Rash - Unclear etiology, no systemic symptoms, little relief with topical steroid or prednisone course. Limited by not examining him in person.  - He was instructed to stop topical steroid, can increase cetirizine to bid in short term, will refer him to dermatology. - Ambulatory referral to Dermatology   Clarene Reamer, FNP-BC  [facility]  05/25/2019 5:51 AM   Follow Up Instructions:    I discussed the assessment and treatment plan with the patient. The patient was provided an opportunity to ask questions and all were answered. The patient agreed with the plan and demonstrated an understanding of the instructions.   The patient was advised to call back or seek an in-person evaluation if the symptoms worsen or if the condition fails to improve as  anticipated.    Elby Beck, FNP

## 2019-05-24 NOTE — Telephone Encounter (Signed)
Pt scheduled and seen today at 2pm in virtual visit. Nothing further needed.

## 2019-05-25 ENCOUNTER — Encounter: Payer: Self-pay | Admitting: Family Medicine

## 2019-06-21 ENCOUNTER — Other Ambulatory Visit: Payer: 59

## 2019-06-27 ENCOUNTER — Encounter: Payer: 59 | Admitting: Family Medicine

## 2019-07-15 ENCOUNTER — Other Ambulatory Visit: Payer: Self-pay | Admitting: Gastroenterology

## 2019-07-15 ENCOUNTER — Other Ambulatory Visit: Payer: Self-pay | Admitting: Family Medicine

## 2019-07-15 DIAGNOSIS — Z131 Encounter for screening for diabetes mellitus: Secondary | ICD-10-CM

## 2019-07-15 DIAGNOSIS — Z0000 Encounter for general adult medical examination without abnormal findings: Secondary | ICD-10-CM

## 2019-07-19 ENCOUNTER — Other Ambulatory Visit (INDEPENDENT_AMBULATORY_CARE_PROVIDER_SITE_OTHER): Payer: 59

## 2019-07-19 ENCOUNTER — Other Ambulatory Visit: Payer: Self-pay

## 2019-07-19 DIAGNOSIS — Z0000 Encounter for general adult medical examination without abnormal findings: Secondary | ICD-10-CM

## 2019-07-19 DIAGNOSIS — Z131 Encounter for screening for diabetes mellitus: Secondary | ICD-10-CM | POA: Diagnosis not present

## 2019-07-19 LAB — BASIC METABOLIC PANEL
BUN: 12 mg/dL (ref 6–23)
CO2: 31 mEq/L (ref 19–32)
Calcium: 9.4 mg/dL (ref 8.4–10.5)
Chloride: 103 mEq/L (ref 96–112)
Creatinine, Ser: 1.01 mg/dL (ref 0.40–1.50)
GFR: 77.01 mL/min (ref >60.00)
Glucose, Bld: 98 mg/dL (ref 70–99)
Potassium: 3.8 mEq/L (ref 3.5–5.1)
Sodium: 140 mEq/L (ref 135–145)

## 2019-07-19 LAB — CBC WITH DIFFERENTIAL/PLATELET
Basophils Absolute: 0 10*3/uL (ref 0.0–0.1)
Basophils Relative: 0.3 % (ref 0.0–3.0)
Eosinophils Absolute: 0.1 10*3/uL (ref 0.0–0.7)
Eosinophils Relative: 1.4 % (ref 0.0–5.0)
HCT: 45.7 % (ref 39.0–52.0)
Hemoglobin: 15.1 g/dL (ref 13.0–17.0)
Lymphocytes Relative: 35.4 % (ref 12.0–46.0)
Lymphs Abs: 2.4 10*3/uL (ref 0.7–4.0)
MCHC: 32.9 g/dL (ref 30.0–36.0)
MCV: 87.8 fl (ref 78.0–100.0)
Monocytes Absolute: 0.6 10*3/uL (ref 0.1–1.0)
Monocytes Relative: 8.2 % (ref 3.0–12.0)
Neutro Abs: 3.8 10*3/uL (ref 1.4–7.7)
Neutrophils Relative %: 54.7 % (ref 43.0–77.0)
Platelets: 243 10*3/uL (ref 150.0–400.0)
RBC: 5.21 Mil/uL (ref 4.22–5.81)
RDW: 13.8 % (ref 11.5–15.5)
WBC: 6.9 10*3/uL (ref 4.0–10.5)

## 2019-07-19 LAB — HEPATIC FUNCTION PANEL
ALT: 7 U/L (ref 0–53)
AST: 13 U/L (ref 0–37)
Albumin: 4.2 g/dL (ref 3.5–5.2)
Alkaline Phosphatase: 52 U/L (ref 39–117)
Bilirubin, Direct: 0.2 mg/dL (ref 0.0–0.3)
Total Bilirubin: 0.8 mg/dL (ref 0.2–1.2)
Total Protein: 6.7 g/dL (ref 6.0–8.3)

## 2019-07-19 LAB — HEMOGLOBIN A1C: Hgb A1c MFr Bld: 6 % (ref 4.6–6.5)

## 2019-07-19 LAB — LIPID PANEL
Cholesterol: 165 mg/dL (ref 0–200)
HDL: 43.9 mg/dL (ref >39.00)
LDL Cholesterol: 109 mg/dL — ABNORMAL HIGH (ref 0–99)
NonHDL: 121.11
Total CHOL/HDL Ratio: 4
Triglycerides: 59 mg/dL (ref 0.0–149.0)
VLDL: 11.8 mg/dL (ref 0.0–40.0)

## 2019-07-22 LAB — PSA, TOTAL WITH REFLEX TO PSA, FREE: PSA, Total: 0.8 ng/mL (ref <=4.0)

## 2019-07-24 ENCOUNTER — Ambulatory Visit (INDEPENDENT_AMBULATORY_CARE_PROVIDER_SITE_OTHER): Payer: 59 | Admitting: Family Medicine

## 2019-07-24 ENCOUNTER — Encounter: Payer: Self-pay | Admitting: Family Medicine

## 2019-07-24 ENCOUNTER — Other Ambulatory Visit: Payer: Self-pay

## 2019-07-24 VITALS — BP 112/80 | HR 75 | Temp 97.8°F | Ht 64.75 in | Wt 170.0 lb

## 2019-07-24 DIAGNOSIS — N521 Erectile dysfunction due to diseases classified elsewhere: Secondary | ICD-10-CM | POA: Diagnosis not present

## 2019-07-24 DIAGNOSIS — N529 Male erectile dysfunction, unspecified: Secondary | ICD-10-CM | POA: Insufficient documentation

## 2019-07-24 DIAGNOSIS — Z0000 Encounter for general adult medical examination without abnormal findings: Secondary | ICD-10-CM | POA: Diagnosis not present

## 2019-07-24 MED ORDER — SILDENAFIL CITRATE 50 MG PO TABS: 25.0000 mg | ORAL_TABLET | Freq: Every day | ORAL | 5 refills | Status: DC | PRN

## 2019-07-24 NOTE — Progress Notes (Signed)
Roger Swilling T. Evelean Bigler, MD Primary Care and [place] at [facility] [place], [address] Phone: [phone]  FAX: [phone]  Roger Ferguson - 54 y.o. male  MRN [id number]  Date of Birth: [birth year]  Visit Date: 07/24/2019  PCP: Owens Loffler, MD  Referred by: Owens Loffler, MD  Chief Complaint  Patient presents with  . Annual Exam    This visit occurred during the SARS-CoV-2 public health emergency.  Safety protocols were in place, including screening questions prior to the visit, additional usage of staff PPE, and extensive cleaning of exam room while observing appropriate contact time as indicated for disinfecting solutions.   Patient Care Team: Owens Loffler, MD as PCP - General Subjective:   Roger Ferguson is a 54 y.o. pleasant patient who presents with the following:  Preventative Health Maintenance Visit:  Health Maintenance Summary Reviewed and updated, unless pt declines services.  Tobacco History Reviewed. Alcohol: No concerns, no excessive use Exercise Habits: Some activity, rec at least 30 mins 5 times a week - does some walking and 1 1/2 hour yoga on the weekend STD concerns: no risk or activity to increase risk Drug Use: None Encouraged self-testicular check  Saw Dr. Einar Gip in the past.  Still feels like some pressure in his chest. Not with exercise. Has had a CT angiogram last year with no cancer  No pain with walking.  Not sure how it lasts.  Not sure if certain foods.   Issues with his sex life. Sometimes will lose it. Once or twice a week.   Health Maintenance  Topic Date Due  . HIV Screening  08/27/1980  . TETANUS/TDAP  06/07/2020  . COLONOSCOPY  07/12/2027  . INFLUENZA VACCINE  Completed   Immunization History  Administered Date(s) Administered  . Influenza Split 03/25/2012  . Influenza Whole 04/24/2010  . Influenza,inj,Quad PF,6+ Mos 04/24/2014, 05/06/2015, 05/02/2016,  05/19/2017, 05/03/2018, 04/04/2019  . Influenza-Unspecified 03/25/2013  . Td 06/07/2010   Patient Active Problem List   Diagnosis Date Noted  . GERD (gastroesophageal reflux disease) 02/13/2013  . 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 B12 deficiency 03/30/2010  . Vitamin D deficiency 03/30/2010  . ANAL FISSURE 01/15/2009    Past Medical History:  Diagnosis Date  . Anal itching    chronic  . GERD (gastroesophageal reflux disease)   . History of hematuria    cystoscopy neg, rads eval  . 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 LAPAROSCOPIC CHOLECYSTECTOMY    . RECTAL BOTOX INJECTION  09-2008    Past Medical History, Surgical History, Social History, Family History, Problem List, Medications, and Allergies have been reviewed and updated if relevant.   General: Denies fever, chills, sweats. No significant weight loss. Eyes: Denies blurring,significant itching ENT: Denies earache, sore throat, and hoarseness. Cardiovascular: Denies chest pains, palpitations, dyspnea on exertion Respiratory: Denies cough, dyspnea at rest,wheeezing Breast: no concerns about lumps GI: Denies nausea, vomiting, diarrhea, constipation, change in bowel habits, abdominal pain, melena, hematochezia GU: Denies penile discharge, ED, urinary flow / outflow problems. No STD concerns. Musculoskeletal: Denies back pain, joint pain Derm: Denies rash, itching Neuro: Denies  paresthesias, frequent falls, frequent headaches Psych: Denies depression, anxiety Endocrine: Denies cold intolerance, heat intolerance, polydipsia Heme: Denies enlarged lymph nodes Allergy: No hayfever  Objective:   BP 112/80   Pulse 75   Temp 97.8 F (36.6 C) (Temporal)   Ht 5' 4.75" (1.645 m)  Wt 170 lb (77.1 kg)   SpO2 98%   BMI 28.51 kg/m  Ideal Body Weight: Weight in (lb) to have BMI = 25: 148.8  Ideal Body Weight: Weight in (lb) to have BMI = 25: 148.8 No exam data  present Depression screen [facility] 2/9 07/24/2019 06/20/2018 07/26/2017  Decreased Interest 0 0 0  Down, Depressed, Hopeless 0 0 0  PHQ - 2 Score 0 0 0     GEN: well developed, well nourished, no acute distress Eyes: conjunctiva and lids normal, PERRLA, EOMI ENT: TM clear, nares clear, oral exam WNL Neck: supple, no lymphadenopathy, no thyromegaly, no JVD Pulm: clear to auscultation and percussion, respiratory effort normal CV: regular rate and rhythm, S1-S2, no murmur, rub or gallop, no bruits, peripheral pulses normal and symmetric, no cyanosis, clubbing, edema or varicosities GI: soft, non-tender; no hepatosplenomegaly, masses; active bowel sounds all quadrants GU: no hernia, testicular mass, penile discharge Lymph: no cervical, axillary or inguinal adenopathy MSK: gait normal, muscle tone and strength WNL, no joint swelling, effusions, discoloration, crepitus  SKIN: clear, good turgor, color WNL, no rashes, lesions, or ulcerations Neuro: normal mental status, normal strength, sensation, and motion Psych: alert; oriented to person, place and time, normally interactive and not anxious or depressed in appearance. All labs reviewed with patient. Results for orders placed or performed in visit on 07/19/19  Hemoglobin A1c  Result Value Ref Range   Hgb A1c MFr Bld 6.0 4.6 - 6.5 %  PSA, Total with Reflex to PSA, Free  Result Value Ref Range   PSA, Total 0.8 < OR = 4.0 ng/mL  Basic metabolic panel  Result Value Ref Range   Sodium 140 135 - 145 mEq/L   Potassium 3.8 3.5 - 5.1 mEq/L   Chloride 103 96 - 112 mEq/L   CO2 31 19 - 32 mEq/L   Glucose, Bld 98 70 - 99 mg/dL   BUN 12 6 - 23 mg/dL   Creatinine, Ser 1.01 0.40 - 1.50 mg/dL   GFR 77.01 >60.00 mL/min   Calcium 9.4 8.4 - 10.5 mg/dL  CBC with Differential/Platelet  Result Value Ref Range   WBC 6.9 4.0 - 10.5 K/uL   RBC 5.21 4.22 - 5.81 Mil/uL   Hemoglobin 15.1 13.0 - 17.0 g/dL   HCT 45.7 39.0 - 52.0 %   MCV 87.8 78.0 - 100.0 fl    MCHC 32.9 30.0 - 36.0 g/dL   RDW 13.8 11.5 - 15.5 %   Platelets 243.0 150.0 - 400.0 K/uL   Neutrophils Relative % 54.7 43.0 - 77.0 %   Lymphocytes Relative 35.4 12.0 - 46.0 %   Monocytes Relative 8.2 3.0 - 12.0 %   Eosinophils Relative 1.4 0.0 - 5.0 %   Basophils Relative 0.3 0.0 - 3.0 %   Neutro Abs 3.8 1.4 - 7.7 K/uL   Lymphs Abs 2.4 0.7 - 4.0 K/uL   Monocytes Absolute 0.6 0.1 - 1.0 K/uL   Eosinophils Absolute 0.1 0.0 - 0.7 K/uL   Basophils Absolute 0.0 0.0 - 0.1 K/uL  Liver Profile  Result Value Ref Range   Total Bilirubin 0.8 0.2 - 1.2 mg/dL   Bilirubin, Direct 0.2 0.0 - 0.3 mg/dL   Alkaline Phosphatase 52 39 - 117 U/L   AST 13 0 - 37 U/L   ALT 7 0 - 53 U/L   Total Protein 6.7 6.0 - 8.3 g/dL   Albumin 4.2 3.5 - 5.2 g/dL  Lipid Profile  Result Value Ref Range   Cholesterol  165 0 - 200 mg/dL   Triglycerides 59.0 0.0 - 149.0 mg/dL   HDL 43.90 >39.00 mg/dL   VLDL 11.8 0.0 - 40.0 mg/dL   LDL Cholesterol 109 (H) 0 - 99 mg/dL   Total CHOL/HDL Ratio 4    NonHDL 121.11     Assessment and Plan:     ICD-10-CM   1. Healthcare maintenance  Z00.00    Doing well, work on weight loss, diet, and exercise Prn viagra to try  Health Maintenance Exam: The patient's preventative maintenance and recommended screening tests for an annual wellness exam were reviewed in full today. Brought up to date unless services declined.  Counselled on the importance of diet, exercise, and its role in overall health and mortality. The patient's FH and SH was reviewed, including their home life, tobacco status, and drug and alcohol status.  Follow-up in 1 year for physical exam or additional follow-up below.  Follow-up: No follow-ups on file. Or follow-up in 1 year if not noted.  No orders of the defined types were placed in this encounter.  Medications Discontinued During This Encounter  Medication Reason  . predniSONE (DELTASONE) 10 MG tablet Completed Course   No orders of the defined types  were placed in this encounter.   Signed,  Maud Deed. Amesha Bailey, MD   Allergies as of 07/24/2019   No Known Allergies     Medication List       Accurate as of July 24, 2019  9:43 AM. If you have any questions, ask your nurse or doctor.        STOP taking these medications   predniSONE 10 MG tablet Commonly known as: DELTASONE Stopped by: Owens Loffler, MD     TAKE these medications   dexlansoprazole 60 MG capsule Commonly known as: Dexilant Take 1 capsule (60 mg total) by mouth daily.   diclofenac 75 MG EC tablet Commonly known as: VOLTAREN TAKE 1 TABLET BY MOUTH 2 TIMES DAILY AS NEEDED JOINT PAIN   fluticasone 0.005 % ointment Commonly known as: CUTIVATE Apply 1 application topically 2 (two) times daily.   hydrocortisone 2.5 % ointment Apply topically 2 (two) times daily as needed. No more than 7 days in a row.   methocarbamol 500 MG tablet Commonly known as: ROBAXIN TAKE 1 TABLET BY MOUTH 3 TIMES DAILY AS NEEDED FOR MUSCLE SPASMS.   triamcinolone cream 0.1 % Commonly known as: KENALOG Apply 1 application topically 2 (two) times daily.   vitamin B-12 1000 MCG tablet Commonly known as: CYANOCOBALAMIN Take 2 tablets (2,000 mcg total) by mouth daily.

## 2019-08-19 ENCOUNTER — Ambulatory Visit: Payer: 59

## 2019-08-19 ENCOUNTER — Ambulatory Visit: Payer: 59 | Attending: Internal Medicine

## 2019-08-19 DIAGNOSIS — Z23 Encounter for immunization: Secondary | ICD-10-CM | POA: Insufficient documentation

## 2019-08-19 NOTE — Progress Notes (Signed)
   Covid-19 Vaccination Clinic  Name:  Shjon Elbert    MRN: [id number] DOB: Oct 23, 1965  08/19/2019  Mr. Constantinides was observed post Covid-19 immunization for 15 minutes without incidence. He was provided with Vaccine Information Sheet and instruction to access the V-Safe system.   Mr. Hartsock was instructed to call 911 with any severe reactions post vaccine: Marland Kitchen Difficulty breathing  . Swelling of your face and throat  . A fast heartbeat  . A bad rash all over your body  . Dizziness and weakness    Immunizations Administered    Name Date Dose VIS Date Route   Pfizer COVID-19 Vaccine 08/19/2019  6:03 PM 0.3 mL 06/21/2019 Intramuscular   Manufacturer: [place]   Lot: [id number]   [place]: [id number]

## 2019-09-13 ENCOUNTER — Ambulatory Visit: Payer: 59 | Attending: Internal Medicine

## 2019-09-13 DIAGNOSIS — Z23 Encounter for immunization: Secondary | ICD-10-CM | POA: Insufficient documentation

## 2019-09-13 NOTE — Progress Notes (Signed)
   Covid-19 Vaccination Clinic  Name:  Roger Ferguson    MRN: [id number] DOB: Nov 08, 1965  09/13/2019  [name] was observed post Covid-19 immunization for 15 minutes without incident. He was provided with Vaccine Information Sheet and instruction to access the V-Safe system.   [name] was instructed to call 911 with any severe reactions post vaccine: Marland Kitchen Difficulty breathing  . Swelling of face and throat  . A fast heartbeat  . A bad rash all over body  . Dizziness and weakness   Immunizations Administered    Name Date Dose VIS Date Route   Pfizer COVID-19 Vaccine 09/13/2019  5:40 PM 0.3 mL 06/21/2019 Intramuscular   Manufacturer: [place]   Lot: [id number]   [place]: [id number]

## 2019-10-23 ENCOUNTER — Other Ambulatory Visit: Payer: Self-pay | Admitting: Gastroenterology

## 2019-10-25 ENCOUNTER — Other Ambulatory Visit: Payer: Self-pay

## 2019-10-25 MED ORDER — DEXILANT 60 MG PO CPDR: 60.0000 mg | DELAYED_RELEASE_CAPSULE | Freq: Every day | ORAL | 3 refills | Status: DC

## 2019-10-25 NOTE — Telephone Encounter (Signed)
Patient called asking if Dr Lorelei Pont would be willing to refill his Dexilant 60 mg which was originally written and refilled by his GI provider-Gupta, Rajesh, MD that he has not seen in a while. Medication is on his med list. Please review.

## 2019-11-18 ENCOUNTER — Other Ambulatory Visit: Payer: Self-pay

## 2019-11-18 ENCOUNTER — Ambulatory Visit (INDEPENDENT_AMBULATORY_CARE_PROVIDER_SITE_OTHER): Payer: BC Managed Care – PPO | Admitting: Family Medicine

## 2019-11-18 ENCOUNTER — Encounter: Payer: Self-pay | Admitting: Family Medicine

## 2019-11-18 VITALS — BP 108/68 | HR 76 | Temp 98.2°F | Ht 64.75 in | Wt 175.5 lb

## 2019-11-18 DIAGNOSIS — L509 Urticaria, unspecified: Secondary | ICD-10-CM | POA: Diagnosis not present

## 2019-11-18 DIAGNOSIS — G8929 Other chronic pain: Secondary | ICD-10-CM

## 2019-11-18 DIAGNOSIS — M549 Dorsalgia, unspecified: Secondary | ICD-10-CM

## 2019-11-18 MED ORDER — DEXAMETHASONE SODIUM PHOSPHATE 10 MG/ML IJ SOLN
10.0000 mg | Freq: Once | INTRAMUSCULAR | Status: AC
  Administered 2019-11-18: 10 mg via INTRAMUSCULAR

## 2019-11-18 NOTE — Progress Notes (Signed)
Roger Ferguson T. Roger Cadotte, MD, [place] at [facility] [place], [address]  Phone: [phone]  FAX: [phone]  Roger Ferguson - 54 y.o. male  MRN [id number]  Date of Birth: Sep 04, 1965  Date: 11/18/2019  PCP: Roger Loffler, MD  Referral: Roger Loffler, MD  Chief Complaint  Patient presents with  . itching all over    comes and goes for month, saw dermotologist  . Back Pain    left side for weeks    This visit occurred during the SARS-CoV-2 public health emergency.  Safety protocols were in place, including screening questions prior to the visit, additional usage of staff PPE, and extensive cleaning of exam room while observing appropriate contact time as indicated for disinfecting solutions.   Subjective:   Roger Ferguson is a 54 y.o. very pleasant male patient with Body mass index is 29.43 kg/m. who presents with the following:  Urticaria.  Atarax will help in the PM Zyrtec will help in the morning  Back and buttocks-this is highly variable, but he does feel better at when he takes antihistamines.  The past week it has been quite a bit worse but  Back is flared up and on the left side -this is deep on the left side, and he does not have any pain in the ribs or in the pelvis region.  He is not had any trauma, accident, or fall.  He does have a history of having some back pain off and on for years. Feels thirsty the night  decadron  Review of Systems is noted in the HPI, as appropriate  Objective:   BP 108/68   Pulse 76   Temp 98.2 F (36.8 C) (Skin)   Ht 5' 4.75" (1.645 m)   Wt 175 lb 8 oz (79.6 kg)   SpO2 96%   BMI 29.43 kg/m   GEN: No acute distress; alert,appropriate. PULM: Breathing comfortably in no respiratory distress PSYCH: Normally interactive.   There is no skin changes or urticaria currently.  Does show me some pictures when this was active.  Full range  of motion at the back in all directions.  Minimal tenderness on the left side in the erector spinae complex and some deeper in the oblique region.  Neurovascularly intact.  Laboratory and Imaging Data:  Assessment and Plan:     ICD-10-CM   1. Urticaria  L50.9 dexamethasone (DECADRON) injection 10 mg  2. Chronic back pain greater than 3 months duration  M54.9    G89.29    Continue with antihistamines as needed.  I am getting give the patient some Decadron in the office today. Zyrtec and Atarax.  Ongoing acute on chronic back pain.  Basic range of motion, reassurance, I think that this is muscular entirety.  I think that his Decadron will also likely help this as well.  Follow-up: No follow-ups on file.  Meds ordered this encounter  Medications  . dexamethasone (DECADRON) injection 10 mg   There are no discontinued medications. No orders of the defined types were placed in this encounter.   Signed,  Roger Ferguson. Roger Mangan, MD   Outpatient Encounter Medications as of 11/18/2019  Medication Sig  . dexlansoprazole (DEXILANT) 60 MG capsule Take 1 capsule (60 mg total) by mouth daily.  . diclofenac (VOLTAREN) 75 MG EC tablet TAKE 1 TABLET BY MOUTH 2 TIMES DAILY AS NEEDED JOINT PAIN  . fluticasone (CUTIVATE) 0.005 % ointment Apply  1 application topically 2 (two) times daily.  . hydrocortisone 2.5 % ointment Apply topically 2 (two) times daily as needed. No more than 7 days in a row.  . methocarbamol (ROBAXIN) 500 MG tablet TAKE 1 TABLET BY MOUTH 3 TIMES DAILY AS NEEDED FOR MUSCLE SPASMS.  . sildenafil (VIAGRA) 50 MG tablet Take 0.5-1 tablets (25-50 mg total) by mouth daily as needed for erectile dysfunction.  . triamcinolone cream (KENALOG) 0.1 % Apply 1 application topically 2 (two) times daily.  . vitamin B-12 (CYANOCOBALAMIN) 1000 MCG tablet Take 2 tablets (2,000 mcg total) by mouth daily.  . [EXPIRED] dexamethasone (DECADRON) injection 10 mg    No facility-administered encounter  medications on file as of 11/18/2019.

## 2020-03-13 ENCOUNTER — Other Ambulatory Visit: Payer: Self-pay

## 2020-03-13 DIAGNOSIS — K648 Other hemorrhoids: Secondary | ICD-10-CM

## 2020-03-13 NOTE — Telephone Encounter (Signed)
Last OV: 11/18/19 for Urticaria Last filled: 05/04/18 No future OV scheduled

## 2020-03-13 NOTE — Telephone Encounter (Signed)
Patient came into office requesting a refill on medication    fluticasone (CUTIVATE) 0.005 % ointment   [id number] - [place], [place] - [address]   Please call patient if this is able to be refill Patient is asking for this to filled ASAP as he is out and is in need

## 2020-03-14 MED ORDER — FLUTICASONE PROPIONATE 0.005 % EX OINT: 1.0000 "application " | TOPICAL_OINTMENT | Freq: Two times a day (BID) | CUTANEOUS | 1 refills | Status: DC

## 2020-05-28 ENCOUNTER — Other Ambulatory Visit (INDEPENDENT_AMBULATORY_CARE_PROVIDER_SITE_OTHER): Payer: BC Managed Care – PPO

## 2020-05-28 ENCOUNTER — Other Ambulatory Visit: Payer: Self-pay

## 2020-05-28 DIAGNOSIS — Z131 Encounter for screening for diabetes mellitus: Secondary | ICD-10-CM | POA: Diagnosis not present

## 2020-05-28 DIAGNOSIS — Z125 Encounter for screening for malignant neoplasm of prostate: Secondary | ICD-10-CM | POA: Diagnosis not present

## 2020-05-28 DIAGNOSIS — E785 Hyperlipidemia, unspecified: Secondary | ICD-10-CM | POA: Diagnosis not present

## 2020-05-28 DIAGNOSIS — Z79899 Other long term (current) drug therapy: Secondary | ICD-10-CM

## 2020-05-28 LAB — HEPATIC FUNCTION PANEL
ALT: 7 U/L (ref 0–53)
AST: 12 U/L (ref 0–37)
Albumin: 4.2 g/dL (ref 3.5–5.2)
Alkaline Phosphatase: 51 U/L (ref 39–117)
Bilirubin, Direct: 0.2 mg/dL (ref 0.0–0.3)
Total Bilirubin: 1 mg/dL (ref 0.2–1.2)
Total Protein: 6.4 g/dL (ref 6.0–8.3)

## 2020-05-28 LAB — BASIC METABOLIC PANEL
BUN: 11 mg/dL (ref 6–23)
CO2: 31 mEq/L (ref 19–32)
Calcium: 9.2 mg/dL (ref 8.4–10.5)
Chloride: 103 mEq/L (ref 96–112)
Creatinine, Ser: 1.11 mg/dL (ref 0.40–1.50)
GFR: 75.16 mL/min (ref >60.00)
Glucose, Bld: 113 mg/dL — ABNORMAL HIGH (ref 70–99)
Potassium: 3.9 mEq/L (ref 3.5–5.1)
Sodium: 140 mEq/L (ref 135–145)

## 2020-05-28 LAB — HEMOGLOBIN A1C: Hgb A1c MFr Bld: 5.8 % (ref 4.6–6.5)

## 2020-05-28 LAB — CBC WITH DIFFERENTIAL/PLATELET
Basophils Absolute: 0 10*3/uL (ref 0.0–0.1)
Basophils Relative: 0.7 % (ref 0.0–3.0)
Eosinophils Absolute: 0.2 10*3/uL (ref 0.0–0.7)
Eosinophils Relative: 2.7 % (ref 0.0–5.0)
HCT: 46.3 % (ref 39.0–52.0)
Hemoglobin: 15.3 g/dL (ref 13.0–17.0)
Lymphocytes Relative: 35 % (ref 12.0–46.0)
Lymphs Abs: 2.3 10*3/uL (ref 0.7–4.0)
MCHC: 33 g/dL (ref 30.0–36.0)
MCV: 86.8 fl (ref 78.0–100.0)
Monocytes Absolute: 0.4 10*3/uL (ref 0.1–1.0)
Monocytes Relative: 6.6 % (ref 3.0–12.0)
Neutro Abs: 3.6 10*3/uL (ref 1.4–7.7)
Neutrophils Relative %: 55 % (ref 43.0–77.0)
Platelets: 235 10*3/uL (ref 150.0–400.0)
RBC: 5.34 Mil/uL (ref 4.22–5.81)
RDW: 13.7 % (ref 11.5–15.5)
WBC: 6.6 10*3/uL (ref 4.0–10.5)

## 2020-05-28 LAB — LIPID PANEL
Cholesterol: 160 mg/dL (ref 0–200)
HDL: 41.5 mg/dL (ref >39.00)
LDL Cholesterol: 99 mg/dL (ref 0–99)
NonHDL: 118.04
Total CHOL/HDL Ratio: 4
Triglycerides: 94 mg/dL (ref 0.0–149.0)
VLDL: 18.8 mg/dL (ref 0.0–40.0)

## 2020-05-29 ENCOUNTER — Other Ambulatory Visit: Payer: BC Managed Care – PPO

## 2020-05-29 LAB — PSA, TOTAL WITH REFLEX TO PSA, FREE: PSA, Total: 0.9 ng/mL (ref <=4.0)

## 2020-06-03 ENCOUNTER — Ambulatory Visit (INDEPENDENT_AMBULATORY_CARE_PROVIDER_SITE_OTHER): Payer: BC Managed Care – PPO | Admitting: Family Medicine

## 2020-06-03 ENCOUNTER — Other Ambulatory Visit: Payer: Self-pay

## 2020-06-03 ENCOUNTER — Encounter: Payer: Self-pay | Admitting: Family Medicine

## 2020-06-03 VITALS — BP 120/86 | HR 72 | Temp 98.7°F | Ht 64.75 in | Wt 171.2 lb

## 2020-06-03 DIAGNOSIS — Z23 Encounter for immunization: Secondary | ICD-10-CM

## 2020-06-03 DIAGNOSIS — Z0000 Encounter for general adult medical examination without abnormal findings: Secondary | ICD-10-CM

## 2020-06-03 NOTE — Progress Notes (Signed)
Myrka Sylva T. Calaya Gildner, MD, [place] [facility] [place], [address]  Phone: [phone]  FAX: [phone]  Roger Ferguson - 54 y.o. male  MRN [id number]  Date of Birth: [birth year]  Date: 06/03/2020  PCP: Owens Loffler, MD  Referral: Owens Loffler, MD  Chief Complaint  Patient presents with  . Annual Exam    This visit occurred during the SARS-CoV-2 public health emergency.  Safety protocols were in place, including screening questions prior to the visit, additional usage of staff PPE, and extensive cleaning of exam room while observing appropriate contact time as indicated for disinfecting solutions.   Patient Care Team: Owens Loffler, MD as PCP - General Subjective:   Roger Ferguson is a 54 y.o. pleasant patient who presents with the following:  Preventative Health Maintenance Visit:  Health Maintenance Summary Reviewed and updated, unless pt declines services.  Tobacco History Reviewed.  Rare cigarette. Alcohol: No concerns, no excessive use Exercise Habits: Yoga 1 times weekly  STD concerns: no risk or activity to increase risk Drug Use: None  Covid booster Flu vaccine  R knee is hurting for a few days. Has not done anything he has had some pain intermittently with the knee for some time.  He has not done anything to aggravate this, and he does not have an effusion.  Feeling down some.   Daughter now working in working.    Does do yoga once a week Taking dog wlks about 30 mins  WOrking some long hours.  Feeling a lot of stress.  At this point, he is not really exercising all that much.  Health Maintenance  Topic Date Due  . Hepatitis C Screening  Never done  . HIV Screening  Never done  . TETANUS/TDAP  06/07/2020  . COLONOSCOPY  07/12/2027  . INFLUENZA VACCINE  Completed  . COVID-19 Vaccine  Completed   Immunization History  Administered Date(s)  Administered  . Influenza Split 03/25/2012  . Influenza Whole 04/24/2010  . Influenza,inj,Quad PF,6+ Mos 04/24/2014, 05/06/2015, 05/02/2016, 05/19/2017, 05/03/2018, 04/04/2019, 06/03/2020  . Influenza-Unspecified 03/25/2013  . PFIZER SARS-COV-2 Vaccination 08/19/2019, 09/13/2019  . Td 06/07/2010   Patient Active Problem List   Diagnosis Date Noted  . Erectile dysfunction 07/24/2019  . GERD (gastroesophageal reflux disease) 02/13/2013  . 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 B12 deficiency 03/30/2010  . Vitamin D deficiency 03/30/2010  . ANAL FISSURE 01/15/2009    Past Medical History:  Diagnosis Date  . GERD (gastroesophageal reflux disease)   . 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 LAPAROSCOPIC CHOLECYSTECTOMY    . RECTAL BOTOX INJECTION  09-2008    Family History  Problem Relation Age of Onset  . Coronary artery disease Unknown 60    Past Medical History, Surgical History, Social History, Family History, Problem List, Medications, and Allergies have been reviewed and updated if relevant.  Review of Systems: Pertinent positives are listed above.  Otherwise, a full 14 point review of systems has been done in full and it is negative except where it is noted positive.  Objective:   BP 120/86   Pulse 72   Temp 98.7 F (37.1 C) (Temporal)   Ht 5' 4.75" (1.645 m)   Wt 171 lb 4 oz (77.7 kg)   SpO2 97%   BMI 28.72 kg/m  Ideal Body Weight: Weight in (lb) to have BMI = 25: 148.8  Ideal Body Weight: Weight in (lb) to have BMI = 25: 148.8 No exam data present Depression screen [facility] 2/9 06/03/2020 07/24/2019 06/20/2018 07/26/2017  Decreased Interest 1 0 0 0  Down, Depressed, Hopeless 1 0 0 0  PHQ - 2 Score 2 0 0 0  Altered sleeping 0 - - -  Tired, decreased energy 0 - - -  Change in appetite 0 - - -  Feeling bad or failure about yourself  1 - - -  Trouble concentrating 0 - - -  Moving slowly or fidgety/restless 0 -  - -  Suicidal thoughts 0 - - -  PHQ-9 Score 3 - - -  Difficult doing work/chores Not difficult at all - - -     GEN: well developed, well nourished, no acute distress Eyes: conjunctiva and lids normal, PERRLA, EOMI ENT: TM clear, nares clear, oral exam WNL Neck: supple, no lymphadenopathy, no thyromegaly, no JVD Pulm: clear to auscultation and percussion, respiratory effort normal CV: regular rate and rhythm, S1-S2, no murmur, rub or gallop, no bruits, peripheral pulses normal and symmetric, no cyanosis, clubbing, edema or varicosities GI: soft, non-tender; no hepatosplenomegaly, masses; active bowel sounds all quadrants GU: deferred Lymph: no cervical, axillary or inguinal adenopathy MSK: gait normal, muscle tone and strength WNL, no joint swelling, effusions, discoloration, crepitus   Right knee: Full extension.  Full flexion.  No effusion.  Stable to varus and valgus stress.  Lachman and drawer testing are all negative.  Bounce home and flexion pinch testing is negative as well as McMurray's test.  Strength is 5/5 throughout.  Grossly normal knee exam.  SKIN: clear, good turgor, color WNL, no rashes, lesions, or ulcerations Neuro: normal mental status, normal strength, sensation, and motion Psych: alert; oriented to person, place and time, normally interactive and not anxious or depressed in appearance.  All labs reviewed with patient. Results for orders placed or performed in visit on 05/28/20  Lipid panel  Result Value Ref Range   Cholesterol 160 0 - 200 mg/dL   Triglycerides 94.0 0 - 149 mg/dL   HDL 41.50 >39.00 mg/dL   VLDL 18.8 0.0 - 40.0 mg/dL   LDL Cholesterol 99 0 - 99 mg/dL   Total CHOL/HDL Ratio 4    NonHDL 118.04   Hepatic function panel  Result Value Ref Range   Total Bilirubin 1.0 0.2 - 1.2 mg/dL   Bilirubin, Direct 0.2 0.0 - 0.3 mg/dL   Alkaline Phosphatase 51 39 - 117 U/L   AST 12 0 - 37 U/L   ALT 7 0 - 53 U/L   Total Protein 6.4 6.0 - 8.3 g/dL   Albumin  4.2 3.5 - 5.2 g/dL  Basic metabolic panel  Result Value Ref Range   Sodium 140 135 - 145 mEq/L   Potassium 3.9 3.5 - 5.1 mEq/L   Chloride 103 96 - 112 mEq/L   CO2 31 19 - 32 mEq/L   Glucose, Bld 113 (H) 70 - 99 mg/dL   BUN 11 6 - 23 mg/dL   Creatinine, Ser 1.11 0.40 - 1.50 mg/dL   GFR 75.16 >60.00 mL/min   Calcium 9.2 8.4 - 10.5 mg/dL  CBC with Differential/Platelet  Result Value Ref Range   WBC 6.6 4.0 - 10.5 K/uL   RBC 5.34 4.22 - 5.81 Mil/uL   Hemoglobin 15.3 13.0 - 17.0 g/dL   HCT 46.3 39 - 52 %   MCV 86.8 78.0 - 100.0 fl   MCHC 33.0 30.0 - 36.0 g/dL  RDW 13.7 11.5 - 15.5 %   Platelets 235.0 150 - 400 K/uL   Neutrophils Relative % 55.0 43 - 77 %   Lymphocytes Relative 35.0 12 - 46 %   Monocytes Relative 6.6 3 - 12 %   Eosinophils Relative 2.7 0 - 5 %   Basophils Relative 0.7 0 - 3 %   Neutro Abs 3.6 1.4 - 7.7 K/uL   Lymphs Abs 2.3 0.7 - 4.0 K/uL   Monocytes Absolute 0.4 0.1 - 1.0 K/uL   Eosinophils Absolute 0.2 0.0 - 0.7 K/uL   Basophils Absolute 0.0 0.0 - 0.1 K/uL  Hemoglobin A1c  Result Value Ref Range   Hgb A1c MFr Bld 5.8 4.6 - 6.5 %  PSA, Total with Reflex to PSA, Free  Result Value Ref Range   PSA, Total 0.9 < OR = 4.0 ng/mL    Assessment and Plan:     ICD-10-CM   1. Healthcare maintenance  Z00.00   2. Need for influenza vaccination  Z23 Flu Vaccine QUAD 6+ mos PF IM (Fluarix Quad PF)   Globally, he is doing okay.  I encouraged him to exercise more.  I think that this will help from fitness and overall wellness standpoint, and I do think he has a early onset of some mild depression.  At this point both of his would prefer for him to try to work on this with lifestyle modification and hopefully some increased physical activity and aerobic activity.  He is a very self-aware patient and very compliant, so I told him to be sure to catch up with me again if he gets worse at all.  Reassured him about his knee.  I do not think there is significant  pathology.  Health Maintenance Exam: The patient's preventative maintenance and recommended screening tests for an annual wellness exam were reviewed in full today. Brought up to date unless services declined.  Counselled on the importance of diet, exercise, and its role in overall health and mortality. The patient's FH and SH was reviewed, including their home life, tobacco status, and drug and alcohol status.  Follow-up in 1 year for physical exam or additional follow-up below.  Follow-up: No follow-ups on file. Or follow-up in 1 year if not noted.  No orders of the defined types were placed in this encounter.  Medications Discontinued During This Encounter  Medication Reason  . vitamin B-12 (CYANOCOBALAMIN) 1000 MCG tablet Patient Preference   Orders Placed This Encounter  Procedures  . Flu Vaccine QUAD 6+ mos PF IM (Fluarix Quad PF)    Signed,  Auna Mikkelsen T. Jacarra Bobak, MD   Allergies as of 06/03/2020   No Known Allergies     Medication List       Accurate as of June 03, 2020 11:59 PM. If you have any questions, ask your nurse or doctor.        STOP taking these medications   vitamin B-12 1000 MCG tablet Commonly known as: CYANOCOBALAMIN Stopped by: Owens Loffler, MD     TAKE these medications   Dexilant 60 MG capsule Generic drug: dexlansoprazole Take 1 capsule (60 mg total) by mouth daily.   diclofenac 75 MG EC tablet Commonly known as: VOLTAREN TAKE 1 TABLET BY MOUTH 2 TIMES DAILY AS NEEDED JOINT PAIN   fluticasone 0.005 % ointment Commonly known as: CUTIVATE Apply 1 application topically 2 (two) times daily.   hydrocortisone 2.5 % ointment Apply topically 2 (two) times daily as needed. No more than 7 days  in a row.   methocarbamol 500 MG tablet Commonly known as: ROBAXIN TAKE 1 TABLET BY MOUTH 3 TIMES DAILY AS NEEDED FOR MUSCLE SPASMS.   sildenafil 50 MG tablet Commonly known as: Viagra Take 0.5-1 tablets (25-50 mg total) by mouth daily as  needed for erectile dysfunction.   triamcinolone 0.1 % Commonly known as: KENALOG Apply 1 application topically 2 (two) times daily.

## 2020-07-13 ENCOUNTER — Other Ambulatory Visit: Payer: Self-pay | Admitting: Family Medicine

## 2020-07-15 ENCOUNTER — Telehealth: Payer: Self-pay | Admitting: Family Medicine

## 2020-07-15 ENCOUNTER — Other Ambulatory Visit: Payer: Self-pay | Admitting: Family Medicine

## 2020-07-15 NOTE — Telephone Encounter (Signed)
Roger Ferguson notified by telephone that the PA was approved on his Dexilent.

## 2020-07-15 NOTE — Telephone Encounter (Signed)
Ok  #90, 3 refills

## 2020-07-15 NOTE — Telephone Encounter (Signed)
Mr. Roger Ferguson has refills.  Insurance requires prior authorization.  PA was completed on CoverMyMeds and sent to [facility] for review.  It can take up to 72 hours for a decision.  Mr. Roger Ferguson notified of this via telephone.

## 2020-07-15 NOTE — Telephone Encounter (Signed)
Patients wife states that the patient is out of the following medication: Dexlansoparazole   [facility] [address] [place], [place]

## 2020-07-16 ENCOUNTER — Telehealth: Payer: Self-pay | Admitting: Family Medicine

## 2020-07-16 NOTE — Telephone Encounter (Signed)
Patient brought by prior authorization paperwork form [employer] for Dexilant.  I have already completed PA on CoverMyMeds which was approved.  [facility]  She states the medication is going to cost $136.10 for a 90 day supply.  I called and advised patient of this.  He wants to know if Dr. Patsy Lager has any other suggestions for medication.  He has tried  Protonix, Prilosec, Omeprazole, etc and the Dexilant has worked the best.

## 2020-07-16 NOTE — Telephone Encounter (Signed)
Patient came into office and dropped off form to be filled out. Placed in tower. For PA.

## 2020-07-16 NOTE — Telephone Encounter (Signed)
Pt called in wanted know about filling out forms for the cost of medication.

## 2020-07-16 NOTE — Telephone Encounter (Signed)
There are a lot of other options.  The cheapest would likely be generic omeprazole, nexium, or aciphex over the counter.  He could try any of them bid - should give more of a constant level of medication in his system.  (dexilant is delay-released)

## 2020-07-16 NOTE — Telephone Encounter (Signed)
Per phone encounter today.  Patient has dropped off forms today to be filled out for patient assistance.

## 2020-07-17 NOTE — Telephone Encounter (Signed)
Roger Ferguson notified as instructed by telephone.  He states he went ahead and picked up the [name] which was a 90 day supply.  He states when he get close to running out, he may make an appointment to follow up with Dr. Lorelei Pont because all those other medication didn't really work for him.

## 2020-07-22 DIAGNOSIS — H11151 Pinguecula, right eye: Secondary | ICD-10-CM | POA: Diagnosis not present

## 2020-07-22 DIAGNOSIS — H00024 Hordeolum internum left upper eyelid: Secondary | ICD-10-CM | POA: Diagnosis not present

## 2020-11-10 ENCOUNTER — Other Ambulatory Visit: Payer: Self-pay | Admitting: Family Medicine

## 2020-11-19 NOTE — Telephone Encounter (Signed)
Pt went to get Dexilant and it was $500. Not sure if PA is required again for the year. Has same insurance. [facility]. Please advise at [phone]

## 2020-11-19 NOTE — Telephone Encounter (Signed)
PA approved 11/19/2020 through 11/18/2021.  Roger Ferguson notified by telephone.

## 2020-11-19 NOTE — Telephone Encounter (Signed)
PA completed on CoverMyMeds and sent to [facility] for review.  Can take up to 72 hours for a decision.

## 2020-12-05 DIAGNOSIS — M79644 Pain in right finger(s): Secondary | ICD-10-CM | POA: Diagnosis not present

## 2020-12-09 ENCOUNTER — Encounter: Payer: Self-pay | Admitting: Cardiology

## 2020-12-09 ENCOUNTER — Other Ambulatory Visit: Payer: Self-pay

## 2020-12-09 ENCOUNTER — Ambulatory Visit: Payer: BC Managed Care – PPO | Admitting: Cardiology

## 2020-12-09 VITALS — BP 151/82 | HR 76 | Temp 98.6°F | Resp 16 | Ht 64.5 in | Wt 170.4 lb

## 2020-12-09 DIAGNOSIS — R0609 Other forms of dyspnea: Secondary | ICD-10-CM | POA: Diagnosis not present

## 2020-12-09 DIAGNOSIS — R0789 Other chest pain: Secondary | ICD-10-CM

## 2020-12-09 DIAGNOSIS — R030 Elevated blood-pressure reading, without diagnosis of hypertension: Secondary | ICD-10-CM | POA: Diagnosis not present

## 2020-12-09 DIAGNOSIS — R0600 Dyspnea, unspecified: Secondary | ICD-10-CM

## 2020-12-09 DIAGNOSIS — R079 Chest pain, unspecified: Secondary | ICD-10-CM | POA: Diagnosis not present

## 2020-12-09 NOTE — Progress Notes (Signed)
Primary Physician/Referring:  Owens Loffler, MD  Patient ID: Roger Ferguson, male    DOB: April 30, 1966, 55 y.o.   MRN: [id number]  Chief Complaint  Patient presents with  . Chest Pain    intermittent  . New Patient (Initial Visit)   HPI:    Roger Ferguson  is a 55 y.o. no significant prior cardiovascular history, referred to me for evaluation of chest discomfort that started a few months ago, described as tightness to discomfort in the middle of the chest, with or without exertion activity.  However in the last 2 weeks he has not had any further episodes.  He also has noticed mild dyspnea on exertion, a year ago he underwent CT scan of the chest and pulmonary embolism was excluded.  He notices dyspnea if he climbs a flight of stairs especially if he is aware that he is climbing a flight of stairs.  No PND or orthopnea.  Past Medical History:  Diagnosis Date  . Acid reflux   . GERD (gastroesophageal reflux disease)   . GERD (gastroesophageal reflux disease)   . 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 LAPAROSCOPIC CHOLECYSTECTOMY    . RECTAL BOTOX INJECTION  09-2008   Family History  Problem Relation Age of Onset  . Lung cancer Father   . Heart disease Father 10       PTCA  . Muscular dystrophy Brother   . Muscular dystrophy Brother     Social History   Tobacco Use  . Smoking status: Light Tobacco Smoker    Types: Cigarettes  . Smokeless tobacco: Never Used  Substance Use Topics  . Alcohol use: Yes    Alcohol/week: 2.0 standard drinks    Types: 1 Glasses of wine, 1 Shots of liquor per week    Comment: occasionally   Marital Status: Married  ROS  Review of Systems  Cardiovascular: Positive for chest pain and dyspnea on exertion. Negative for leg swelling.  Gastrointestinal: Negative for melena.   Objective  Blood pressure (!) 151/82, pulse 76, temperature 98.6 F (37 C), temperature source Temporal, resp. rate 16,  height 5' 4.5" (1.638 m), weight 170 lb 6.4 oz (77.3 kg), SpO2 98 %. Body mass index is 28.8 kg/m.  Vitals with BMI 12/09/2020 06/03/2020 11/18/2019  Height 5' 4.5" 5' 4.75" 5' 4.75"  Weight 170 lbs 6 oz 171 lbs 4 oz 175 lbs 8 oz  BMI 28.81 56.81 27.51  Systolic 700 174 944  Diastolic 82 86 68  Pulse 76 72 76     Physical Exam Constitutional:      Appearance: He is overweight.  Neck:     Vascular: No carotid bruit or JVD.  Cardiovascular:     Rate and Rhythm: Normal rate and regular rhythm.     Pulses: Intact distal pulses.     Heart sounds: Normal heart sounds. No murmur heard. No gallop.   Pulmonary:     Effort: Pulmonary effort is normal.     Breath sounds: Normal breath sounds.  Abdominal:     General: Bowel sounds are normal.     Palpations: Abdomen is soft.  Musculoskeletal:        General: No swelling.     Laboratory examination:   Recent Labs    05/28/20 0821  NA 140  K 3.9  CL 103  CO2 31  GLUCOSE 113*  BUN 11  CREATININE 1.11  CALCIUM 9.2   CrCl cannot be calculated (Patient's  most recent lab result is older than the maximum 21 days allowed.).  CMP Latest Ref Rng & Units 05/28/2020 07/19/2019 03/04/2019  Glucose 70 - 99 mg/dL 113(H) 98 116(H)  BUN 6 - 23 mg/dL 11 12 11   Creatinine 0.40 - 1.50 mg/dL 1.11 1.01 1.00  Sodium 135 - 145 mEq/L 140 140 139  Potassium 3.5 - 5.1 mEq/L 3.9 3.8 4.2  Chloride 96 - 112 mEq/L 103 103 103  CO2 19 - 32 mEq/L 31 31 30   Calcium 8.4 - 10.5 mg/dL 9.2 9.4 9.5  Total Protein 6.0 - 8.3 g/dL 6.4 6.7 7.1  Total Bilirubin 0.2 - 1.2 mg/dL 1.0 0.8 1.0  Alkaline Phos 39 - 117 U/L 51 52 50  AST 0 - 37 U/L 12 13 16   ALT 0 - 53 U/L 7 7 8    CBC Latest Ref Rng & Units 05/28/2020 07/19/2019 03/04/2019  WBC 4.0 - 10.5 K/uL 6.6 6.9 5.3  Hemoglobin 13.0 - 17.0 g/dL 15.3 15.1 14.8  Hematocrit 39.0 - 52.0 % 46.3 45.7 45.5  Platelets 150.0 - 400.0 K/uL 235.0 243.0 243.0    Lipid Panel Recent Labs    05/28/20 0821  CHOL 160  TRIG 94.0   LDLCALC 99  VLDL 18.8  HDL 41.50  CHOLHDL 4   Lipid Panel     Component Value Date/Time   CHOL 160 05/28/2020 0821   TRIG 94.0 05/28/2020 0821   HDL 41.50 05/28/2020 0821   CHOLHDL 4 05/28/2020 0821   VLDL 18.8 05/28/2020 0821   LDLCALC 99 05/28/2020 0821     HEMOGLOBIN A1C Lab Results  Component Value Date   HGBA1C 5.8 05/28/2020   TSH No results for input(s): TSH in the last 8760 hours.  Medications and allergies  No Known Allergies    Medication prior to this encounter:   Outpatient Medications Prior to Visit  Medication Sig Dispense Refill  . DEXILANT 60 MG capsule TAKE 1 CAPSULE BY MOUTH EVERY DAY 90 capsule 1  . diclofenac (VOLTAREN) 75 MG EC tablet TAKE 1 TABLET BY MOUTH 2 TIMES DAILY AS NEEDED JOINT PAIN 90 tablet 1  . fluticasone (CUTIVATE) 0.005 % ointment Apply 1 application topically 2 (two) times daily. 60 g 1  . hydrocortisone 2.5 % ointment Apply topically 2 (two) times daily as needed. No more than 7 days in a row. 60 g 0  . methocarbamol (ROBAXIN) 500 MG tablet TAKE 1 TABLET BY MOUTH 3 TIMES DAILY AS NEEDED FOR MUSCLE SPASMS. 90 tablet 1  . sildenafil (VIAGRA) 50 MG tablet Take 0.5-1 tablets (25-50 mg total) by mouth daily as needed for erectile dysfunction. 10 tablet 5  . triamcinolone cream (KENALOG) 0.1 % Apply 1 application topically 2 (two) times daily. 454 g 0   No facility-administered medications prior to visit.     FINAL MEDICATION AS OF TODAY:   Medications after current encounter Current Outpatient Medications  Medication Instructions  . DEXILANT 60 MG capsule TAKE 1 CAPSULE BY MOUTH EVERY DAY  . diclofenac (VOLTAREN) 75 MG EC tablet TAKE 1 TABLET BY MOUTH 2 TIMES DAILY AS NEEDED JOINT PAIN  . fluticasone (CUTIVATE) 0.005 % ointment 1 application, Topical, 2 times daily  . hydrocortisone 2.5 % ointment Topical, 2 times daily PRN, No more than 7 days in a row.  . methocarbamol (ROBAXIN) 500 MG tablet TAKE 1 TABLET BY MOUTH 3 TIMES DAILY  AS NEEDED FOR MUSCLE SPASMS.  . sildenafil (VIAGRA) 25-50 mg, Oral, Daily PRN  . triamcinolone cream (  KENALOG) 0.1 % 1 application, Topical, 2 times daily    Radiology:   CT angiogram chest 03/04/2019: Negative for pulmonary embolism.  Normal heart size.  Negative for coronary calcification.  Minimal atherosclerotic calcification of the aortic arch.  Cardiac Studies:   Treadmill stress test 02/27/16 Comments: The resting electrocardiogram demonstrated normal sinus rhythm, normal resting conduction, no resting arrhythmias and normal rest repolarization. The stress electrocardiogram was normal.  There were no significant arrhythmias. Patient exercised on Bruce protocol for 9:00 minutes and achieved 92% of Max Predicted HR (Target HR was >85% MPHR) and 10.16 METS. Stress symptoms included fatigue and dyspnea. Normal BP response.  Exercise capacity was normal. Impression: Normal stress EKG.  EKG:    EKG 12/09/2020: Normal sinus rhythm at rate of 69 beats minute, normal axis.  No evidence of ischemia, normal EKG.      Assessment     ICD-10-CM   1. Atypical chest pain  R07.89 EKG 12-Lead    PCV ECHOCARDIOGRAM COMPLETE    CT CARDIAC SCORING ([facility] LOCATIONS ONLY)    PCV CARDIAC STRESS TEST  2. Prehypertension  R03.0 Lipoprotein A (LPA)    Apo A1 + B + Ratio  3. Dyspnea on exertion  R06.00 PCV ECHOCARDIOGRAM COMPLETE     There are no discontinued medications.  No orders of the defined types were placed in this encounter.  Orders Placed This Encounter  Procedures  . CT CARDIAC SCORING ([facility] LOCATIONS ONLY)    Standing Status:   Future    Standing Expiration Date:   02/08/2021    Order Specific Question:   Preferred imaging location?    Answer:   [facility]  . Lipoprotein A (LPA)  . Apo A1 + B + Ratio  . PCV CARDIAC STRESS TEST    Standing Status:   Future    Standing Expiration Date:   02/08/2021  . EKG 12-Lead  . PCV ECHOCARDIOGRAM COMPLETE    Standing Status:   Future    Standing  Expiration Date:   12/09/2021   Recommendations:   Roger Ferguson is a 55 y.o. no significant cardiovascular history, who had seen several years ago for atypical chest pain and musculoskeletal chest pain now referred to me for evaluation of atypical chest pain, started a few months ago.  No clear-cut exertional component to this.  Last a minute or 2 at most.  He is also noticed mild dyspnea on exertion over the past 2 years, has had CT scan of the chest for PE.  Surprisingly this showed mild aortic atherosclerosis.  Chest pain symptoms appear to be at most atypical.  I also reviewed his external labs, very normal looking lipid profile.  There is no family history of premature coronary disease.  His blood pressure is also mildly elevated today and his blood pressure was also minimally elevated at his annual visit about 8 months ago with a diastolic of 85 mmHg.  He will monitor his blood pressure at home.  We discussed regarding weight loss and making changes to his lifestyle.  I will obtain coronary calcium score for restratification along with an echocardiogram and a routine treadmill exercise stress test.  In view of aortic atherosclerosis, I will also obtain LPA and APO B: April a ratio.  I would like to see him back in 6 weeks for follow-up.   Adrian Prows, MD, [facility] 12/09/2020, 10:13 AM Office: [phone]

## 2020-12-10 ENCOUNTER — Ambulatory Visit
Admission: RE | Admit: 2020-12-10 | Discharge: 2020-12-10 | Disposition: A | Discharge status: Home / Self Care | Payer: BC Managed Care – PPO | Source: Ambulatory Visit | Attending: Cardiology | Admitting: Cardiology

## 2020-12-10 DIAGNOSIS — R0789 Other chest pain: Secondary | ICD-10-CM

## 2020-12-10 LAB — APO A1 + B + RATIO
Apolipo. B/A-1 Ratio: 0.7 ratio (ref 0.0–0.7)
Apolipoprotein A-1: 143 mg/dL (ref 101–178)
Apolipoprotein B: 97 mg/dL — ABNORMAL HIGH (ref <90)

## 2020-12-10 LAB — LIPOPROTEIN A (LPA): Lipoprotein (a): <8.4 nmol/L (ref <75.0)

## 2020-12-10 NOTE — Progress Notes (Signed)
Coronary calcium score 12/10/2020: 1.  Coronary calcium score of 0.  MeSA database 0 percentile.  Normal aortic measurements in the thorax. 2.  Low density along the posterolateral aspect of the right atrium. Findings could represent a small lipoma or focal area of lipomatous hypertrophy. Consider further evaluation with echocardiography.

## 2020-12-17 ENCOUNTER — Other Ambulatory Visit: Payer: Self-pay

## 2020-12-17 ENCOUNTER — Ambulatory Visit: Payer: BC Managed Care – PPO

## 2020-12-17 ENCOUNTER — Other Ambulatory Visit: Payer: BC Managed Care – PPO

## 2020-12-17 DIAGNOSIS — R0789 Other chest pain: Secondary | ICD-10-CM | POA: Diagnosis not present

## 2020-12-17 DIAGNOSIS — R0609 Other forms of dyspnea: Secondary | ICD-10-CM | POA: Diagnosis not present

## 2020-12-18 ENCOUNTER — Ambulatory Visit: Payer: BC Managed Care – PPO

## 2020-12-18 DIAGNOSIS — R0789 Other chest pain: Secondary | ICD-10-CM

## 2020-12-20 NOTE — Progress Notes (Signed)
Echocardiogram 12/17/2020: Normal LV systolic function with visual EF 60-65%. Left ventricle cavity is normal in size. Normal global wall motion. Normal diastolic filling pattern, normal LAP. No significant valvular heart disease. No prior study for comparison.

## 2020-12-20 NOTE — Progress Notes (Signed)
Exercise treadmill stress test 12/18/2020: Exercise treadmill stress test performed using Bruce protocol.  Patient reached 14 METS, and 105% of age predicted maximum heart rate.  Exercise capacity was excellent.  No chest pain reported.  Normal heart rate response. Resting hypertension (150/100 mmHg) with hypertensive response on exercise (peak BP 220/60 mmHg). Stress EKG revealed no ischemic changes. Low risk study.

## 2021-01-20 ENCOUNTER — Ambulatory Visit: Payer: BC Managed Care – PPO | Admitting: Cardiology

## 2021-01-26 ENCOUNTER — Ambulatory Visit: Payer: BC Managed Care – PPO | Admitting: Cardiology

## 2021-01-26 ENCOUNTER — Encounter: Payer: Self-pay | Admitting: Cardiology

## 2021-01-26 ENCOUNTER — Other Ambulatory Visit: Payer: Self-pay

## 2021-01-26 VITALS — BP 132/82 | HR 74 | Temp 98.4°F | Resp 17 | Ht 64.5 in | Wt 174.6 lb

## 2021-01-26 DIAGNOSIS — R0600 Dyspnea, unspecified: Secondary | ICD-10-CM

## 2021-01-26 DIAGNOSIS — R0609 Other forms of dyspnea: Secondary | ICD-10-CM | POA: Diagnosis not present

## 2021-01-26 DIAGNOSIS — R0789 Other chest pain: Secondary | ICD-10-CM | POA: Diagnosis not present

## 2021-01-26 NOTE — Progress Notes (Signed)
Primary Physician/Referring:  Owens Loffler, MD  Roger Ferguson ID: Roger Ferguson, male    DOB: 07-05-1966, 55 y.o.   MRN: [id number]  Chief Complaint  Roger Ferguson presents with   Follow-up    6 WEEKS   Chest Pain   Shortness of Breath   HPI:    Roger Ferguson  is a 55 y.o. no significant prior cardiovascular history, referred to me for evaluation of chest discomfort that started a few months ago, described as tightness to discomfort in the middle of the chest, with or without exertion activity.  However in the last 2 weeks Roger Ferguson has not had any further episodes.  Roger Ferguson also has noticed mild dyspnea on exertion, a year ago Roger Ferguson underwent CT scan of the chest and pulmonary embolism was excluded.  Roger Ferguson notices dyspnea if Roger Ferguson climbs a flight of stairs especially if Roger Ferguson is aware that Roger Ferguson is climbing a flight of stairs.    I saw him about 4 to 6 weeks ago for chest pain which appeared to be mostly musculoskeletal but underwent treadmill stress test, echocardiogram and coronary calcium score and presents for follow-up.  Roger Ferguson has not had any further chest pain although does notice mild dyspnea when Roger Ferguson climbs a flight of stairs but states that Roger Ferguson is able to exercise well.  Past Medical History:  Diagnosis Date   Acid reflux    GERD (gastroesophageal reflux disease)    GERD (gastroesophageal reflux disease)    Scapholunate dissociation 10/16/2011   Past Surgical History:  Procedure Laterality Date   LAPAROSCOPIC CHOLECYSTECTOMY     RECTAL BOTOX INJECTION  09-2008   Family History  Problem Relation Age of Onset   Lung cancer Father    Heart disease Father 56       PTCA   Muscular dystrophy Brother    Muscular dystrophy Brother     Social History   Tobacco Use   Smoking status: Light Smoker    Types: Cigarettes   Smokeless tobacco: Never  Substance Use Topics   Alcohol use: Yes    Alcohol/week: 2.0 standard drinks    Types: 1 Glasses of wine, 1 Shots of liquor per week    Comment: occasionally   Marital  Status: Married  ROS  Review of Systems  Cardiovascular:  Positive for chest pain and dyspnea on exertion. Negative for leg swelling.  Gastrointestinal:  Negative for melena.  Objective  Blood pressure 132/82, pulse 74, temperature 98.4 F (36.9 C), temperature source Temporal, resp. rate 17, height 5' 4.5" (1.638 m), weight 174 lb 9.6 oz (79.2 kg), SpO2 98 %. Body mass index is 29.51 kg/m.  Vitals with BMI 01/26/2021 01/26/2021 01/26/2021  Height - - 5' 4.5"  Weight - - 174 lbs 10 oz  BMI - - 40.98  Systolic 119 147 829  Diastolic 82 84 78  Pulse 74 73 77     Physical Exam Constitutional:      Appearance: Roger Ferguson is overweight.  Neck:     Vascular: No carotid bruit or JVD.  Cardiovascular:     Rate and Rhythm: Normal rate and regular rhythm.     Pulses: Intact distal pulses.     Heart sounds: Normal heart sounds. No murmur heard.   No gallop.  Pulmonary:     Effort: Pulmonary effort is normal.     Breath sounds: Normal breath sounds.  Abdominal:     General: Bowel sounds are normal.     Palpations: Abdomen is soft.  Musculoskeletal:  General: No swelling.    Laboratory examination:   Recent Labs    05/28/20 0821  NA 140  K 3.9  CL 103  CO2 31  GLUCOSE 113*  BUN 11  CREATININE 1.11  CALCIUM 9.2   CrCl cannot be calculated (Roger Ferguson's most recent lab result is older than the maximum 21 days allowed.).  CMP Latest Ref Rng & Units 05/28/2020 07/19/2019 03/04/2019  Glucose 70 - 99 mg/dL 113(H) 98 116(H)  BUN 6 - 23 mg/dL 11 12 11   Creatinine 0.40 - 1.50 mg/dL 1.11 1.01 1.00  Sodium 135 - 145 mEq/L 140 140 139  Potassium 3.5 - 5.1 mEq/L 3.9 3.8 4.2  Chloride 96 - 112 mEq/L 103 103 103  CO2 19 - 32 mEq/L 31 31 30   Calcium 8.4 - 10.5 mg/dL 9.2 9.4 9.5  Total Protein 6.0 - 8.3 g/dL 6.4 6.7 7.1  Total Bilirubin 0.2 - 1.2 mg/dL 1.0 0.8 1.0  Alkaline Phos 39 - 117 U/L 51 52 50  AST 0 - 37 U/L 12 13 16   ALT 0 - 53 U/L 7 7 8    CBC Latest Ref Rng & Units 05/28/2020  07/19/2019 03/04/2019  WBC 4.0 - 10.5 K/uL 6.6 6.9 5.3  Hemoglobin 13.0 - 17.0 g/dL 15.3 15.1 14.8  Hematocrit 39.0 - 52.0 % 46.3 45.7 45.5  Platelets 150.0 - 400.0 K/uL 235.0 243.0 243.0   Lipid Panel Recent Labs    05/28/20 0821  CHOL 160  TRIG 94.0  LDLCALC 99  VLDL 18.8  HDL 41.50  CHOLHDL 4   Lipid Panel     Component Value Date/Time   CHOL 160 05/28/2020 0821   TRIG 94.0 05/28/2020 0821   HDL 41.50 05/28/2020 0821   CHOLHDL 4 05/28/2020 0821   VLDL 18.8 05/28/2020 0821   LDLCALC 99 05/28/2020 0821    12/09/2020: Apolipo. B/A-1 Ratio 0.0 - 0.7 ratio 0.7   Borderline High     90 -  99              High               100 - 130              Very High               >130  Lipoprotein (a) <75.0 nmol/L Normal <8.4     HEMOGLOBIN A1C Lab Results  Component Value Date   HGBA1C 5.8 05/28/2020   TSH No results for input(s): TSH in the last 8760 hours.  Medications and allergies  No Known Allergies    Medication prior to this encounter:   Outpatient Medications Prior to Visit  Medication Sig Dispense Refill   DEXILANT 60 MG capsule TAKE 1 CAPSULE BY MOUTH EVERY DAY 90 capsule 1   diclofenac (VOLTAREN) 75 MG EC tablet TAKE 1 TABLET BY MOUTH 2 TIMES DAILY AS NEEDED JOINT PAIN 90 tablet 1   fluticasone (CUTIVATE) 0.005 % ointment Apply 1 application topically 2 (two) times daily. 60 g 1   hydrocortisone 2.5 % ointment Apply topically 2 (two) times daily as needed. No more than 7 days in a row. 60 g 0   methocarbamol (ROBAXIN) 500 MG tablet TAKE 1 TABLET BY MOUTH 3 TIMES DAILY AS NEEDED FOR MUSCLE SPASMS. 90 tablet 1   sildenafil (VIAGRA) 50 MG tablet Take 0.5-1 tablets (25-50 mg total) by mouth daily as needed for erectile dysfunction. 10 tablet 5   triamcinolone cream (KENALOG) 0.1 %  Apply 1 application topically 2 (two) times daily. 454 g 0   No facility-administered medications prior to visit.    FINAL MEDICATION AS OF TODAY:   Medications after current  encounter Current Outpatient Medications  Medication Instructions   DEXILANT 60 MG capsule TAKE 1 CAPSULE BY MOUTH EVERY DAY   diclofenac (VOLTAREN) 75 MG EC tablet TAKE 1 TABLET BY MOUTH 2 TIMES DAILY AS NEEDED JOINT PAIN   fluticasone (CUTIVATE) 0.005 % ointment 1 application, Topical, 2 times daily   hydrocortisone 2.5 % ointment Topical, 2 times daily PRN, No more than 7 days in a row.   methocarbamol (ROBAXIN) 500 MG tablet TAKE 1 TABLET BY MOUTH 3 TIMES DAILY AS NEEDED FOR MUSCLE SPASMS.   sildenafil (VIAGRA) 25-50 mg, Oral, Daily PRN   triamcinolone cream (KENALOG) 0.1 % 1 application, Topical, 2 times daily    Radiology:   CT angiogram chest 03/04/2019: Negative for pulmonary embolism.  Normal heart size.  Negative for coronary calcification.  Minimal atherosclerotic calcification of the aortic arch.  Cardiac Studies:   [facility] ECHOCARDIOGRAM COMPLETE 00/92/3300 Normal LV systolic function with visual EF 60-65%. Left ventricle cavity is normal in size. Normal global wall motion. Normal diastolic filling pattern, normal LAP. No significant valvular heart disease. No prior study for comparison.   [facility] CARDIAC STRESS TEST 12/18/2020 Exercise treadmill stress test 12/18/2020: Exercise treadmill stress test performed using Bruce protocol.  Roger Ferguson reached 14 METS, and 105% of age predicted maximum heart rate.  Exercise capacity was excellent.  No chest pain reported.  Normal heart rate response. Resting hypertension (150/100 mmHg) with hypertensive response on exercise (peak BP 220/60 mmHg). Stress EKG revealed no ischemic changes.     Low risk study.  Coronary calcium score 12/10/2020: 1. Coronary calcium score is 0. 2. Low density along the posterolateral aspect of the right atrium. Findings could represent a small lipoma or focal area of lipomatous hypertrophy. Consider further evaluation with echocardiography.    EKG:    EKG 12/09/2020: Normal sinus rhythm at rate of 69 beats  minute, normal axis.  No evidence of ischemia, normal EKG.      Assessment     ICD-10-CM   1. Atypical chest pain  R07.89     2. Dyspnea on exertion  R06.00        There are no discontinued medications.  No orders of the defined types were placed in this encounter.  No orders of the defined types were placed in this encounter.  Recommendations:   Roger Ferguson is a 55 y.o. no significant cardiovascular history, who had seen several years ago for atypical chest pain and musculoskeletal chest pain now referred to me for evaluation of atypical chest pain, started a few months ago.  No clear-cut exertional component to this.  Last a minute or 2 at most.  Roger Ferguson is also noticed mild dyspnea on exertion over the past 2 years, has had CT scan of the chest for PE.  Surprisingly this showed mild aortic atherosclerosis.  I saw him about 4 to 6 weeks ago for chest pain which appeared to be mostly musculoskeletal but underwent treadmill stress test, echocardiogram and coronary calcium score and presents for follow-up.  I discussed with him the findings of normal echocardiogram including diastolic function, review of the excellent exercise tolerance on the treadmill stress test and coronary calcium score of 0.  Also advanced lipid profile testing does not reveal any significant abnormality.  Suspect the aortic atherosclerosis noted on CT scan  may been an overcall or even if it is, I have recommended Roger Ferguson to lose about 10 pounds to 15 pounds in weight and to exercise regularly.  I do not think Roger Ferguson needs statins for now.  Otherwise stable from cardiac standpoint, I will see him back on a as needed basis.  Pre-hypertension was discussed with the Roger Ferguson, weight loss will certainly improve this as well.   Adrian Prows, MD, [facility] 01/26/2021, 5:38 PM Office: [phone]

## 2021-02-15 ENCOUNTER — Other Ambulatory Visit: Payer: Self-pay

## 2021-02-15 MED ORDER — DICLOFENAC SODIUM 75 MG PO TBEC: DELAYED_RELEASE_TABLET | ORAL | 1 refills | Status: AC

## 2021-02-15 MED ORDER — METHOCARBAMOL 500 MG PO TABS: 500.0000 mg | ORAL_TABLET | Freq: Three times a day (TID) | ORAL | 1 refills | Status: AC | PRN

## 2021-02-15 NOTE — Telephone Encounter (Signed)
Pt said recently starting with lower lt back pain that does not radiate to legs; pt said more like back spasms pt has had before.  No known injury or any heaving lifting. Pt said he thinks he slept wrong.Pt said the pain level is 5-6 and comes and goes. Sitting position seems to affect the back pain and how often pt has back spasms. Pt requesting refill of methocarbamol 500 mg last filled # 90 x 1 on 11/28/2018 and diclofenac 75 mg # 90 x 1. Pt last seen annual exam on 06/03/20 and no future appt scheduled.  Pt request cb when meds refilled. [facility]. Sending note to Dr Lorelei Pont and Butch Penny CMA.

## 2021-02-15 NOTE — Telephone Encounter (Signed)
Pt notified that refills for requested meds have been sent to pharmacy by Dr Lorelei Pont. Pt is appreciative and will pick up at pharmacy. If pt condition does not improve or worsens pt will cb for appt.

## 2021-02-17 ENCOUNTER — Other Ambulatory Visit: Payer: Self-pay | Admitting: Family Medicine

## 2021-02-17 DIAGNOSIS — L259 Unspecified contact dermatitis, unspecified cause: Secondary | ICD-10-CM

## 2021-02-18 MED ORDER — TRIAMCINOLONE ACETONIDE 0.1 % EX CREA: TOPICAL_CREAM | CUTANEOUS | 0 refills | Status: AC

## 2021-02-18 NOTE — Telephone Encounter (Signed)
Last office visit 06/03/2020 for CPE.  Last refilled 05/14/2019 for 454 g with no refills.  No future appointments.

## 2021-04-16 ENCOUNTER — Telehealth: Payer: Self-pay | Admitting: Family Medicine

## 2021-04-16 DIAGNOSIS — Z20822 Contact with and (suspected) exposure to covid-19: Secondary | ICD-10-CM | POA: Diagnosis not present

## 2021-04-16 NOTE — Telephone Encounter (Signed)
Pt called stating he would like to speak to nurse about symptoms he have been having (sore throat,slight fever, with neg covid).  Told pt it would have to be a virtual, pt declined. Pt states he would like to have something called in, he has been advised that we can not prescribe without an  appt, and pt still decline.

## 2021-04-16 NOTE — Telephone Encounter (Signed)
Called patient to find out what was going on and he stated that he had a sore throat and a slight fever, he stated he took a covid test and was negative. He stated he was waiting on another test but hasn't got the result. Patient was informed that today we could not send in any Rx's for him with out him being seen. Patient did not want to be seen virtually and understood we couldn't accommodate this today. Patient was also informed that he could go to an UC to be treated, he stated he wanted to just stay at home and take something for his sore throat and the fever. Patient was appreciative and understanding about the call and would let us know if he needed to set up an appointment.

## 2021-07-06 ENCOUNTER — Telehealth: Payer: Self-pay

## 2021-07-06 NOTE — Telephone Encounter (Signed)
[facility] - Client TELEPHONE ADVICE RECORD [facility] Patient Name: RONIN REHFELDT NA Gender: Male DOB: [birth year] Age: 55 Y 10 M 7 D Return Phone Number: [phone] (Primary), [phone] (Secondary) Address: City/ State/ Zip: [place]  [address] Client [facility] - Client Client [place] Provider Owens Loffler - MD Contact Type Call Who Is Calling Patient / Member / Family / Caregiver Call Type Triage / Clinical Relationship To Patient Self Return Phone Number [phone] (Primary) Chief Complaint Sore Throat Reason for Call Symptomatic / Request for [place] states he feel chills, itchy throat, and body aches. Would like to know if he needs flu medication. Translation No Nurse Assessment Nurse: Dierdre Harness, RN, [place] Date/Time (Eastern Time): 07/02/2021 10:07:26 PM Confirm and document reason for call. If symptomatic, describe symptoms. ---Caller states he feel chills, itchy throat, stuffy nose, and body aches. Would like to know if he needs flu medication. Caller states she also had fever last night with other sx (100.8) and took tylenol pm. At home covid test negative. Does the patient have any new or worsening symptoms? ---Yes Will a triage be completed? ---Yes Related visit to physician within the last 2 weeks? ---No Does the PT have any chronic conditions? (i.e. diabetes, asthma, this includes High risk factors for pregnancy, etc.) ---No Is this a behavioral health or substance abuse call? ---No Guidelines Guideline Title Affirmed Question Affirmed Notes Nurse Date/Time (Eastern Time) Influenza - Seasonal [1] Probable influenza (fever) with no complications AND [9] NOT HIGH RISK Adams, RN, [place] 07/02/2021 10:10:08 PM Disp. Time Eilene Ghazi Time) Disposition Final User 07/02/2021 10:24:19 PM Home Care Yes Kadour, RN,  [place] PLEASE NOTE: All timestamps contained within this report are represented as [place] Standard Time. CONFIDENTIALTY NOTICE: This fax transmission is intended only for the addressee. It contains information that is legally privileged, confidential or otherwise protected from use or disclosure. If you are not the intended recipient, you are strictly prohibited from reviewing, disclosing, copying using or disseminating any of this information or taking any action in reliance on or regarding this information. If you have received this fax in error, please notify us immediately by telephone so that we can arrange for its return to [place]. Phone: [phone], Toll-Free: [phone], Fax: [phone] Page: 2 of 2 Call Id: [id number] Caller Disagree/Comply Comply Caller Understands Yes PreDisposition Home Care Care Advice Given Per Guideline HOME CARE: * You should be able to treat this at home. * Influenza is commonly known as the 'flu'. * There are things that you can do to feel better and decrease your symptoms. * Cough: Use cough drops. * Feeling dehydrated: Drink extra liquids. If the air in your home is dry, use a humidifier. * Fever: For fever over 101 F (38.3 C), take acetaminophen every 4 to 6 hours (Adults 650 mg) OR ibuprofen every 6 to 8 hours (Adults 400 mg). * Muscle aches, headache, and other pains: Often this comes and goes with the fever. Take acetaminophen every 4 to 6 hours (Adults 650 mg) OR ibuprofen every 6 to 8 hours (Adults 400 mg). * If you have flu-like symptoms, please stay home until the fever is gone for at least 24 hours off fever-reducing medicines. A temperature of 100 F (37.8C) or higher is a fever. * Avoid close contact with others (hugging, kissing). NASAL WASHES FOR A STUFFY NOSE: COUGH DROPS FOR COUGH: AVOID TOBACCO SMOKE: * Avoid smoke  from tobacco and e-cigarettes. * Drink warm fluids. Inhale warm mist. This can help relax the airway and also loosen up phlegm.  EXPECTED COURSE: * Fever 2 to 3 days * Nasal discharge 7 to 14 days * Cough 2 to 3 weeks CALL BACK IF: * Fever lasts over 3 days * Runny nose lasts over 10 days * Cough lasts over 3 weeks * Difficulty breathing occurs * You become worse CARE ADVICE given per Influenza - Seasonal (Adult) guideline. * ACETAMINOPHEN - EXTRA STRENGTH TYLENOL: Take 1,000 mg (two 500 mg pills) every 6 to 8 hours as needed. Each Extra Strength Tylenol pill has 500 mg of acetaminophen. The most you should take is 6 pills a day (3,000 mg total). Note: In [place], the maximum is 8 pills a day (4,000 mg total). Comments User: Threasa Heads, RN Date/Time Eilene Ghazi Time): 07/02/2021 10:24:09 PM [email or web address].drugs.com/interactions-check.[email or web address]_list=([phone]

## 2021-07-06 NOTE — Telephone Encounter (Signed)
Noted  

## 2021-07-06 NOTE — Telephone Encounter (Signed)
I spoke with pt; pt did not go anywhere over weekend; pt said he is feeling better; no other symptoms except stuffy nose. Offered an appt and pt said he would cb if need appt. Sending note to Dr Lorelei Pont and Butch Penny CMA.

## 2022-11-05 ENCOUNTER — Other Ambulatory Visit (HOSPITAL_BASED_OUTPATIENT_CLINIC_OR_DEPARTMENT_OTHER): Payer: Self-pay

## 2022-11-05 DIAGNOSIS — Z1211 Encounter for screening for malignant neoplasm of colon: Secondary | ICD-10-CM | POA: Diagnosis not present

## 2022-11-05 DIAGNOSIS — Z23 Encounter for immunization: Secondary | ICD-10-CM | POA: Diagnosis not present

## 2022-11-05 DIAGNOSIS — Z0001 Encounter for general adult medical examination with abnormal findings: Secondary | ICD-10-CM | POA: Diagnosis not present

## 2022-11-05 DIAGNOSIS — Z125 Encounter for screening for malignant neoplasm of prostate: Secondary | ICD-10-CM | POA: Diagnosis not present

## 2022-11-05 MED ORDER — TAMSULOSIN HCL 0.4 MG PO CAPS
ORAL_CAPSULE | Freq: Every day | ORAL | 2 refills | Status: DC
  Filled 2022-11-05: qty 90, 90d supply, fill #0

## 2022-11-05 MED ORDER — FLUTICASONE PROPIONATE 0.005 % EX OINT
1.0000 | TOPICAL_OINTMENT | Freq: Two times a day (BID) | CUTANEOUS | 1 refills | Status: AC
  Filled 2022-11-05 (×2): qty 60, 30d supply, fill #0

## 2022-11-05 MED ORDER — VITAMIN D (ERGOCALCIFEROL) 50000 UNITS PO CAPS
50000.0000 [IU] | ORAL_CAPSULE | ORAL | 1 refills | Status: DC
  Filled 2022-11-05: qty 12, 90d supply, fill #0

## 2022-11-05 MED ORDER — MINOXIDIL FOR MEN 5 % EX SOLN
1.0000 mL | Freq: Two times a day (BID) | CUTANEOUS | 2 refills | Status: AC
  Filled 2022-11-05: qty 120, 60d supply, fill #0
  Filled 2022-11-07: qty 120, 30d supply, fill #0

## 2022-11-05 MED ORDER — MELOXICAM 15 MG PO TABS
15.0000 mg | ORAL_TABLET | Freq: Every day | ORAL | 1 refills | Status: DC | PRN
  Filled 2022-11-05: qty 30, 30d supply, fill #0

## 2022-11-06 ENCOUNTER — Other Ambulatory Visit (HOSPITAL_BASED_OUTPATIENT_CLINIC_OR_DEPARTMENT_OTHER): Payer: Self-pay

## 2022-11-07 ENCOUNTER — Other Ambulatory Visit (HOSPITAL_BASED_OUTPATIENT_CLINIC_OR_DEPARTMENT_OTHER): Payer: Self-pay

## 2022-11-07 DIAGNOSIS — L659 Nonscarring hair loss, unspecified: Secondary | ICD-10-CM | POA: Diagnosis not present

## 2022-11-07 DIAGNOSIS — R8290 Unspecified abnormal findings in urine: Secondary | ICD-10-CM | POA: Diagnosis not present

## 2022-11-09 ENCOUNTER — Other Ambulatory Visit (HOSPITAL_BASED_OUTPATIENT_CLINIC_OR_DEPARTMENT_OTHER): Payer: Self-pay

## 2022-11-17 ENCOUNTER — Other Ambulatory Visit (HOSPITAL_BASED_OUTPATIENT_CLINIC_OR_DEPARTMENT_OTHER): Payer: Self-pay

## 2022-11-22 ENCOUNTER — Other Ambulatory Visit (HOSPITAL_BASED_OUTPATIENT_CLINIC_OR_DEPARTMENT_OTHER): Payer: Self-pay

## 2022-11-22 MED ORDER — CLOTRIMAZOLE-BETAMETHASONE 1-0.05 % EX CREA
TOPICAL_CREAM | CUTANEOUS | 1 refills | Status: DC
  Filled 2022-11-22: qty 45, 14d supply, fill #0

## 2022-11-28 ENCOUNTER — Other Ambulatory Visit (HOSPITAL_BASED_OUTPATIENT_CLINIC_OR_DEPARTMENT_OTHER): Payer: Self-pay

## 2023-03-20 ENCOUNTER — Other Ambulatory Visit (HOSPITAL_BASED_OUTPATIENT_CLINIC_OR_DEPARTMENT_OTHER): Payer: Self-pay

## 2023-03-20 DIAGNOSIS — R7989 Other specified abnormal findings of blood chemistry: Secondary | ICD-10-CM | POA: Diagnosis not present

## 2023-03-20 DIAGNOSIS — S00522A Blister (nonthermal) of oral cavity, initial encounter: Secondary | ICD-10-CM | POA: Diagnosis not present

## 2023-03-20 DIAGNOSIS — J00 Acute nasopharyngitis [common cold]: Secondary | ICD-10-CM | POA: Diagnosis not present

## 2023-03-20 MED ORDER — PREDNISOLONE SODIUM PHOSPHATE 15 MG/5ML PO SOLN
5.0000 mL | Freq: Four times a day (QID) | OROMUCOSAL | 0 refills | Status: AC
  Filled 2023-03-20: qty 180, 9d supply, fill #0

## 2023-04-02 ENCOUNTER — Other Ambulatory Visit: Payer: Self-pay

## 2023-04-02 ENCOUNTER — Emergency Department (HOSPITAL_COMMUNITY)
Admission: EM | Admit: 2023-04-02 | Discharge: 2023-04-02 | Disposition: A | Discharge status: Home / Self Care | Payer: BLUE CROSS/BLUE SHIELD | Attending: Emergency Medicine | Admitting: Emergency Medicine

## 2023-04-02 DIAGNOSIS — S31551A Open bite of unspecified external genital organs, male, initial encounter: Secondary | ICD-10-CM | POA: Diagnosis not present

## 2023-04-02 DIAGNOSIS — Z23 Encounter for immunization: Secondary | ICD-10-CM | POA: Diagnosis not present

## 2023-04-02 DIAGNOSIS — W540XXA Bitten by dog, initial encounter: Secondary | ICD-10-CM | POA: Diagnosis not present

## 2023-04-02 DIAGNOSIS — Y9301 Activity, walking, marching and hiking: Secondary | ICD-10-CM | POA: Diagnosis not present

## 2023-04-02 DIAGNOSIS — S3121XA Laceration without foreign body of penis, initial encounter: Secondary | ICD-10-CM | POA: Insufficient documentation

## 2023-04-02 DIAGNOSIS — S3125XA Open bite of penis, initial encounter: Secondary | ICD-10-CM | POA: Diagnosis not present

## 2023-04-02 MED ORDER — AMOXICILLIN-POT CLAVULANATE 875-125 MG PO TABS
1.0000 | ORAL_TABLET | Freq: Two times a day (BID) | ORAL | 0 refills | Status: AC
  Filled 2023-04-02 – 2023-04-03 (×2): qty 14, 7d supply, fill #0

## 2023-04-02 MED ORDER — AMOXICILLIN-POT CLAVULANATE 875-125 MG PO TABS: 1.0000 | ORAL_TABLET | Freq: Two times a day (BID) | ORAL | 0 refills | Status: DC

## 2023-04-02 MED ORDER — LIDOCAINE HCL (PF) 1 % IJ SOLN
5.0000 mL | Freq: Once | INTRAMUSCULAR | Status: AC
  Administered 2023-04-02: 5 mL
  Filled 2023-04-02: qty 30

## 2023-04-02 MED ORDER — LIDOCAINE HCL (PF) 1 % IJ SOLN
5.0000 mL | Freq: Once | INTRAMUSCULAR | Status: AC
  Administered 2023-04-02: 5 mL via INTRADERMAL
  Filled 2023-04-02: qty 30

## 2023-04-02 MED ORDER — BACITRACIN ZINC 500 UNIT/GM EX OINT
TOPICAL_OINTMENT | Freq: Once | CUTANEOUS | Status: AC
  Filled 2023-04-02: qty 0.9

## 2023-04-02 MED ORDER — AMOXICILLIN-POT CLAVULANATE 875-125 MG PO TABS
1.0000 | ORAL_TABLET | Freq: Once | ORAL | Status: AC
  Administered 2023-04-02: 1 via ORAL
  Filled 2023-04-02: qty 1

## 2023-04-02 MED ORDER — BACITRACIN ZINC 500 UNIT/GM EX OINT
TOPICAL_OINTMENT | CUTANEOUS | Status: AC
  Filled 2023-04-02: qty 0.9

## 2023-04-02 MED ORDER — TETANUS-DIPHTH-ACELL PERTUSSIS 5-2.5-18.5 LF-MCG/0.5 IM SUSY
0.5000 mL | PREFILLED_SYRINGE | Freq: Once | INTRAMUSCULAR | Status: AC
  Administered 2023-04-02: 0.5 mL via INTRAMUSCULAR
  Filled 2023-04-02: qty 0.5

## 2023-04-02 NOTE — Discharge Instructions (Addendum)
As discussed, sutures need to be removed in 7 days.  You can go to your PCP, urgent care, or emergency room to get the removed.  Leave the dressing on your laceration for the next 24 hours. After that, you can leave the wound open to air. After the first 24 hours, you can begin to clean the wound site with soap and water to prevent crusting over the suture knots.  Do not go into pools, lakes, or oceans until the sutures are removed in order to prevent infection.   Prescription for Augmentin sent to the pharmacy. Take this medication twice a day for the next 7 days.  We have provided you with Bacitracin ointment, you can apply a thin layer to the wound as well.  Get help right away if: You have a red streak going away from your wound. You have any of these coming from your wound: Non-clear fluid. More blood. Pus or a bad smell. You have trouble moving your injured area. You lose feeling (have numbness) or feel tingling anywhere on your body.

## 2023-04-02 NOTE — ED Provider Notes (Signed)
[place] EMERGENCY DEPARTMENT AT [facility] Provider Note   CSN: [id number] Arrival date & time: 04/02/23  1825     History  Chief Complaint  Patient presents with   Animal Bite    Roger Ferguson is a 57 y.o. male with a history of GERD and vitamin B12 deficiency who presents the ED today with his wife for an animal bite. Patient reports he was walking down stairs at his friend's house when their dog jumped up and bit him in the groin. He states that he went home and wrapped the wound, which helped stop the bleeding. The dog's rabies vaccine is up to date. Tetanus is not up to date. No other complains or concerns at this time.    Home Medications Prior to Admission medications   Medication Sig Start Date End Date Taking? Authorizing Provider  amoxicillin-clavulanate (AUGMENTIN) 875-125 MG tablet Take 1 tablet by mouth every 12 (twelve) hours for 7 days. 04/02/23 04/09/23  Maxwell Marion, PA-C  clotrimazole-betamethasone (LOTRISONE) cream APPLY TO THE AFFECTED AND SURROUNDING AREAS OF SKIN 2 TIMES PER DAY IN THE MORNING AND EVENING FOR 2 WEEKS 11/22/22     DEXILANT 60 MG capsule TAKE 1 CAPSULE BY MOUTH EVERY DAY 11/11/20   Copland, Karleen Hampshire, MD  diclofenac (VOLTAREN) 75 MG EC tablet TAKE 1 TABLET BY MOUTH 2 TIMES DAILY AS NEEDED JOINT PAIN 02/15/21   Copland, Karleen Hampshire, MD  fluticasone (CUTIVATE) 0.005 % ointment Apply 1 application topically 2 (two) times daily. 03/14/20   Copland, Karleen Hampshire, MD  fluticasone (CUTIVATE) 0.005 % ointment APPLY A THIN LAYER TO THE AFFECTED AREA(S) BY TOPICAL ROUTE 2 TIMES PER DAY ; RUB IN GENTLY AND COMPLETELY 11/05/22     hydrocortisone 2.5 % ointment Apply topically 2 (two) times daily as needed. No more than 7 days in a row. 05/04/18   Excell Seltzer, MD  magic mouthwash (multi-ingredient) oral suspension Take 5 mLs by mouth (swish and spit) 4 (four) times daily as needed. Keep in mouth for 10 minutes before spitting. Do not eat/drink for 30 minutes after.  03/20/23     meloxicam (MOBIC) 15 MG tablet Take 1 tablet every day by oral route as needed. 11/05/22     methocarbamol (ROBAXIN) 500 MG tablet Take 1 tablet (500 mg total) by mouth 3 (three) times daily as needed for muscle spasms. 02/15/21   Copland, Karleen Hampshire, MD  MINOXIDIL, TOPICAL, (MINOXIDIL FOR MEN) 5 % SOLN APPLY 1 MILLILITER BY TOPICAL ROUTE 2 TIMES PER DAY , EVERY DAY, DIRECTLY ONTO THE SCALP IN THE HAIR LOSS AREA 11/05/22     sildenafil (VIAGRA) 50 MG tablet Take 0.5-1 tablets (25-50 mg total) by mouth daily as needed for erectile dysfunction. 07/24/19   Copland, Karleen Hampshire, MD  tamsulosin (FLOMAX) 0.4 MG CAPS capsule Take 1 capsule every day by oral route at bedtime. 11/05/22     triamcinolone cream (KENALOG) 0.1 % APPLY TO AFFECTED AREA TWICE A DAY 02/18/21   Copland, Karleen Hampshire, MD  Vitamin D, Ergocalciferol, 50000 units CAPS Take 1 capsule every week by oral route. 11/05/22         Allergies    Patient has no known allergies.    Review of Systems   Review of Systems  Skin:        Animal bite  All other systems reviewed and are negative.   Physical Exam Updated Vital Signs BP (!) 141/90 (BP Location: Right Arm)   Pulse 73   Temp 98 F (36.7 C) (  Oral)   Resp 16   Ht 5' 4.5" (1.638 m)   Wt 80 kg   SpO2 98%   BMI 29.81 kg/m  Physical Exam Vitals and nursing note reviewed. Exam conducted with a chaperone present.  Constitutional:      General: He is not in acute distress.    Appearance: Normal appearance.  HENT:     Head: Normocephalic and atraumatic.     Mouth/Throat:     Mouth: Mucous membranes are moist.  Eyes:     Conjunctiva/sclera: Conjunctivae normal.     Pupils: Pupils are equal, round, and reactive to light.  Cardiovascular:     Rate and Rhythm: Normal rate and regular rhythm.     Pulses: Normal pulses.  Pulmonary:     Effort: Pulmonary effort is normal.     Breath sounds: Normal breath sounds.  Abdominal:     Palpations: Abdomen is soft.     Tenderness: There is  no abdominal tenderness.  Skin:    General: Skin is warm and dry.     Findings: No rash.     Comments: 6 cm laceration to dorsum of shaft of penis proximal to glans penis. Small 1 cm abrasion at suprapubic area  Neurological:     General: No focal deficit present.     Mental Status: He is alert.  Psychiatric:        Mood and Affect: Mood normal.        Behavior: Behavior normal.     ED Results / Procedures / Treatments   Labs (all labs ordered are listed, but only abnormal results are displayed) Labs Reviewed - No data to display  EKG None  Radiology No results found.  Procedures .Marland KitchenLaceration Repair  Date/Time: 04/02/2023 8:08 PM  Performed by: Maxwell Marion, PA-C Authorized by: Maxwell Marion, PA-C   Consent:    Consent obtained:  Written   Consent given by:  Patient   Risks discussed:  Infection and pain Anesthesia:    Anesthesia method:  Local infiltration   Local anesthetic:  Lidocaine 1% w/o epi Laceration details:    Location: penis.   Length (cm):  6 Exploration:    Hemostasis achieved with:  Direct pressure Treatment:    Area cleansed with:  Saline   Irrigation method:  Pressure wash Skin repair:    Repair method:  Sutures   Suture size:  5-0   Suture material:  Prolene   Suture technique:  Simple interrupted   Number of sutures:  7 Approximation:    Approximation:  Loose Repair type:    Repair type:  Simple Post-procedure details:    Dressing:  Antibiotic ointment and sterile dressing   Procedure completion:  Tolerated Comments:     6 sutures to the dorsum, 1 to the left lateral aspect     Medications Ordered in ED Medications  bacitracin ointment (has no administration in time range)  Tdap (BOOSTRIX) injection 0.5 mL (0.5 mLs Intramuscular Given 04/02/23 1930)  lidocaine (PF) (XYLOCAINE) 1 % injection 5 mL (5 mLs Infiltration Given by Other 04/02/23 1953)  amoxicillin-clavulanate (AUGMENTIN) 875-125 MG per tablet 1 tablet (1 tablet Oral  Given 04/02/23 1930)    ED Course/ Medical Decision Making/ A&P                                 Medical Decision Making Risk OTC drugs. Prescription drug management.   This patient presents to  the ED for concern of animal bite, this involves an extensive number of treatment options, and is a complaint that carries with it a high risk of complications and morbidity.   Differential diagnosis includes: animal bite, laceration, abrasion, etc.   Comorbidities  See HPI above   Additional History  Additional history obtained from patient's wife.   Problem List / ED Course / Critical Interventions / Medication Management  Animal bite I ordered medications including: First dose of Augmentin given in the ED tonight. Tdap updated. I have reviewed the patients home medicines and have made adjustments as needed   Social Determinants of Health  Social connection   Test / Admission - Considered  Instructed to have sutures removed in 7 days Prescription for Augmentin sent to the pharmacy. Return precautions provided.        Final Clinical Impression(s) / ED Diagnoses Final diagnoses:  Dog bite, initial encounter    Rx / DC Orders ED Discharge Orders          Ordered    amoxicillin-clavulanate (AUGMENTIN) 875-125 MG tablet  Every 12 hours,   Status:  Discontinued        04/02/23 1908    amoxicillin-clavulanate (AUGMENTIN) 875-125 MG tablet  Every 12 hours        04/02/23 2006              Maxwell Marion, PA-C 04/02/23 2042    Pricilla Loveless, MD 04/02/23 2233

## 2023-04-02 NOTE — ED Triage Notes (Signed)
Pt reports dog bite to penis, bleeding controlled in triage. Per dog owner all vaccines up to date.

## 2023-04-03 ENCOUNTER — Other Ambulatory Visit (HOSPITAL_BASED_OUTPATIENT_CLINIC_OR_DEPARTMENT_OTHER): Payer: Self-pay

## 2023-04-03 ENCOUNTER — Other Ambulatory Visit: Payer: Self-pay

## 2023-04-05 ENCOUNTER — Other Ambulatory Visit (HOSPITAL_BASED_OUTPATIENT_CLINIC_OR_DEPARTMENT_OTHER): Payer: Self-pay

## 2023-04-05 DIAGNOSIS — S00522A Blister (nonthermal) of oral cavity, initial encounter: Secondary | ICD-10-CM | POA: Diagnosis not present

## 2023-04-05 DIAGNOSIS — L811 Chloasma: Secondary | ICD-10-CM | POA: Diagnosis not present

## 2023-04-05 DIAGNOSIS — R7989 Other specified abnormal findings of blood chemistry: Secondary | ICD-10-CM | POA: Diagnosis not present

## 2023-04-05 MED ORDER — HYDROQUINONE 4 % EX CREA
1.0000 | TOPICAL_CREAM | Freq: Two times a day (BID) | CUTANEOUS | 1 refills | Status: AC
Start: 2023-04-05 — End: ?
  Filled 2023-04-05: qty 28.35, 30d supply, fill #0

## 2023-04-10 DIAGNOSIS — S3121XD Laceration without foreign body of penis, subsequent encounter: Secondary | ICD-10-CM | POA: Diagnosis not present

## 2023-04-24 DIAGNOSIS — S00522A Blister (nonthermal) of oral cavity, initial encounter: Secondary | ICD-10-CM | POA: Diagnosis not present

## 2023-04-24 DIAGNOSIS — S3121XD Laceration without foreign body of penis, subsequent encounter: Secondary | ICD-10-CM | POA: Diagnosis not present

## 2023-05-04 ENCOUNTER — Telehealth: Payer: Self-pay | Admitting: *Deleted

## 2023-05-04 NOTE — Telephone Encounter (Signed)
Transition Care Management Unsuccessful Follow-up Telephone Call  Date of discharge and from where:  [facility]  04/02/2023  Attempts:  1st Attempt  Reason for unsuccessful TCM follow-up call:  No answer/busy

## 2023-05-08 ENCOUNTER — Telehealth: Payer: Self-pay | Admitting: *Deleted

## 2023-05-08 NOTE — Telephone Encounter (Signed)
Transition Care Management Follow-up Telephone Call Date of discharge and from where: [facility]  04/02/2023 How have you been since you were released from the hospital? Doing fine feeling much better  Any questions or concerns? No  Items Reviewed: Did the pt receive and understand the discharge instructions provided? Yes  Medications obtained and verified? Yes  Other? No  Any new allergies since your discharge? No  Dietary orders reviewed? No Do you have support at home? Yes      Follow up appointments reviewed:  PCP Hospital f/u appt confirmed? Yes  Has already seen the PCP   Are transportation arrangements needed? No  If their condition worsens, is the pt aware to call PCP or go to the Emergency Dept.? Yes Was the patient provided with contact information for the PCP's office or ED? Yes Was to pt encouraged to call back with questions or concerns? Yes

## 2023-06-09 ENCOUNTER — Other Ambulatory Visit (HOSPITAL_BASED_OUTPATIENT_CLINIC_OR_DEPARTMENT_OTHER): Payer: Self-pay

## 2023-06-09 MED ORDER — INFLUENZA VIRUS VACC SPLIT PF (FLUZONE) 0.5 ML IM SUSY
0.5000 mL | PREFILLED_SYRINGE | Freq: Once | INTRAMUSCULAR | 0 refills | Status: AC
  Filled 2023-06-09: qty 0.5, 1d supply, fill #0

## 2023-08-22 ENCOUNTER — Other Ambulatory Visit (HOSPITAL_BASED_OUTPATIENT_CLINIC_OR_DEPARTMENT_OTHER): Payer: Self-pay

## 2023-08-22 DIAGNOSIS — R0609 Other forms of dyspnea: Secondary | ICD-10-CM | POA: Diagnosis not present

## 2023-08-22 DIAGNOSIS — Z1211 Encounter for screening for malignant neoplasm of colon: Secondary | ICD-10-CM | POA: Diagnosis not present

## 2023-08-22 DIAGNOSIS — Z23 Encounter for immunization: Secondary | ICD-10-CM | POA: Diagnosis not present

## 2023-08-22 DIAGNOSIS — Z125 Encounter for screening for malignant neoplasm of prostate: Secondary | ICD-10-CM | POA: Diagnosis not present

## 2023-08-22 DIAGNOSIS — Z0001 Encounter for general adult medical examination with abnormal findings: Secondary | ICD-10-CM | POA: Diagnosis not present

## 2023-08-22 MED ORDER — MELOXICAM 15 MG PO TABS
15.0000 mg | ORAL_TABLET | Freq: Every day | ORAL | 0 refills | Status: AC | PRN
  Filled 2023-08-22: qty 30, 30d supply, fill #0

## 2023-08-22 MED ORDER — DICLOFENAC SODIUM 1 % EX GEL
2.0000 g | Freq: Four times a day (QID) | CUTANEOUS | 1 refills | Status: AC
  Filled 2023-08-22: qty 100, 12d supply, fill #0

## 2023-08-24 DIAGNOSIS — R7989 Other specified abnormal findings of blood chemistry: Secondary | ICD-10-CM | POA: Diagnosis not present

## 2023-08-24 DIAGNOSIS — Z0001 Encounter for general adult medical examination with abnormal findings: Secondary | ICD-10-CM | POA: Diagnosis not present

## 2023-08-24 DIAGNOSIS — E559 Vitamin D deficiency, unspecified: Secondary | ICD-10-CM | POA: Diagnosis not present

## 2023-08-24 DIAGNOSIS — R7303 Prediabetes: Secondary | ICD-10-CM | POA: Diagnosis not present

## 2023-08-24 DIAGNOSIS — E7849 Other hyperlipidemia: Secondary | ICD-10-CM | POA: Diagnosis not present

## 2023-08-27 NOTE — Progress Notes (Unsigned)
Office Visit Note  Patient: Roger Ferguson             Date of Birth: [birth year]           MRN: [id number]             PCP: Harvest Forest, MD Referring: Harvest Forest, MD Visit Date: 08/28/2023 Occupation: @GUAROCC @  Subjective:  Pain in multiple joints  History of Present Illness: Vearl Allbaugh is a 58 y.o. male seen for evaluation of pain in multiple joints.  According the patient his symptoms started about 1 year ago with right knee joint pain.  The pain gradually moved to his left knee as well.  He has never noticed any joint swelling.  He states the pain is brought upon by kneeling on his knees when he does he will stretches.  He feels pain behind his the right knee.  He also notices pain while he is climbing stairs.  He states 2 years ago he twisted his right ankle joint.  At the time he was evaluated and had x-rays which were unremarkable.  He has occasional pain in his right ankle joint and he takes meloxicam for the right ankle pain.  The pain gets worse with prolonged standing.  He recalls that in 2011 he fell and landed on his left wrist.  He had left wrist joint arthrogram at the time and had cortisone injections x 2.  He has occasional pain in his left wrist.  He has not noticed any joint swelling.  He also complains of occasional discomfort in his bilateral hands.  He has occasional discomfort in his right elbow which she points to right lateral epicondyle region.  None of the other joints are painful.  There is no history of psoriasis.  He is right-handed and works in  Psychologist, occupational.  He walks and practices yoga for exercise.  There is no family history of autoimmune disease.    Activities of Daily Living:  Patient reports morning stiffness for 2 minutes.   Patient Denies nocturnal pain.  Difficulty dressing/grooming: Denies Difficulty climbing stairs:  Difficulty getting out of chair: Reports Difficulty using hands for taps, buttons, cutlery, and/or writing:  Denies  Review of Systems  Constitutional:  Positive for fatigue.  HENT:  Positive for mouth sores and mouth dryness.   Eyes:  Negative for dryness.  Respiratory:  Positive for shortness of breath.   Cardiovascular:  Negative for chest pain and palpitations.  Gastrointestinal:  Negative for blood in stool, constipation and diarrhea.  Endocrine: Positive for increased urination.  Genitourinary:  Negative for involuntary urination.  Musculoskeletal:  Positive for joint pain, joint pain, myalgias, morning stiffness and myalgias. Negative for gait problem, joint swelling, muscle weakness and muscle tenderness.  Skin:  Positive for color change, rash and hair loss. Negative for sensitivity to sunlight.  Allergic/Immunologic: Negative for susceptible to infections.  Neurological:  Positive for headaches. Negative for dizziness.  Hematological:  Negative for swollen glands.  Psychiatric/Behavioral:  Positive for depressed mood. Negative for sleep disturbance. The patient is nervous/anxious.     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   B12 deficiency 03/30/2010   Vitamin D deficiency 03/30/2010   ANAL FISSURE 01/15/2009    Past Medical History:  Diagnosis Date   Acid reflux    GERD (gastroesophageal reflux disease)    GERD (gastroesophageal  reflux disease)    Scapholunate dissociation 10/16/2011    Family History  Problem Relation Age of Onset   Lung cancer Father    Heart disease Father 5       PTCA   Muscular dystrophy Brother    Muscular dystrophy Brother    Past Surgical History:  Procedure Laterality Date   LAPAROSCOPIC CHOLECYSTECTOMY     RECTAL BOTOX INJECTION  09-2008   Social History   Social History Narrative   Regular exercise---no      Originally from [place]   Married with 2 children          Immunization History  Administered Date(s) Administered   Influenza Split 03/25/2012   Influenza Whole 04/24/2010   Influenza, Seasonal, Injecte, Preservative Fre 06/09/2023   Influenza,inj,Quad PF,6+ Mos 04/24/2014, 05/06/2015, 05/02/2016, 05/19/2017, 05/03/2018, 04/04/2019, 06/03/2020   Influenza-Unspecified 03/25/2013   PFIZER(Purple Top)SARS-COV-2 Vaccination 08/19/2019, 09/13/2019   Td 06/07/2010   Tdap 04/02/2023     Objective: Vital Signs: BP 121/73 (BP Location: Right Arm, Patient Position: Sitting, Cuff Size: Normal)   Pulse 73   Resp 14   Ht 5' 5.5" (1.664 m)   Wt 173 lb (78.5 kg)   BMI 28.35 kg/m    Physical Exam Vitals and nursing note reviewed.  Constitutional:      Appearance: He is well-developed.  HENT:     Head: Normocephalic and atraumatic.  Eyes:     Conjunctiva/sclera: Conjunctivae normal.     Pupils: Pupils are equal, round, and reactive to light.  Cardiovascular:     Rate and Rhythm: Normal rate and regular rhythm.     Heart sounds: Normal heart sounds.  Pulmonary:     Effort: Pulmonary effort is normal.     Breath sounds: Normal breath sounds.  Abdominal:     General: Bowel sounds are normal.     Palpations: Abdomen is soft.  Musculoskeletal:     Cervical back: Normal range of motion and neck supple.  Skin:    General: Skin is warm and dry.     Capillary Refill: Capillary refill takes less than 2 seconds.  Neurological:     Mental Status: He is alert and oriented to person, place, and time.  Psychiatric:        Behavior: Behavior normal.      Musculoskeletal Exam: Cervical, thoracic and lumbar spine 1 good range of motion.  Shoulder joints, elbow joints, wrist joints, MCPs PIPs and DIPs in good range of motion.  He had tenderness over right lateral epicondyle region.  He had some PIP and DIP thickening with no synovitis.  Hip joints and knee joints were in good range of motion.  Crepitus was noted on the extension of the right knee joint.   No warmth swelling or effusion was noted.  There was no tenderness over ankles or MTPs.  No synovitis was noted on the examination.  CDAI Exam: CDAI Score: -- Patient Global: --; Provider Global: -- Swollen: --; Tender: -- Joint Exam 08/28/2023   No joint exam has been documented for this visit   There is currently no information documented on the homunculus. Go to the Rheumatology activity and complete the homunculus joint exam.  Investigation: No additional findings.  Imaging: XR KNEE 3 VIEW RIGHT Result Date: 08/28/2023 Moderate medial compartment narrowing was noted.  Intercondylar and lateral osteophytes were noted.  Calcification was noted in the popliteal region.  Moderate chondromalacia patella was noted. Impression: These findings are suggestive of moderate osteoarthritis and  moderate chondromalacia patella.  XR Ankle 2 Views Right Result Date: 08/28/2023 No tibiotalar or subtalar joint space narrowing was noted. Impression: Unremarkable x-rays of the ankle.  XR KNEE 3 VIEW LEFT Result Date: 08/28/2023 No medial or lateral compartment narrowing was noted.  Moderate patellofemoral narrowing was noted. Impression: These findings suggestive of moderate chondromalacia patella.   Recent Labs: Lab Results  Component Value Date   WBC 6.6 05/28/2020   HGB 15.3 05/28/2020   PLT 235.0 05/28/2020   NA 140 05/28/2020   K 3.9 05/28/2020   CL 103 05/28/2020   CO2 31 05/28/2020   GLUCOSE 113 (H) 05/28/2020   BUN 11 05/28/2020   CREATININE 1.11 05/28/2020   BILITOT 1.0 05/28/2020   ALKPHOS 51 05/28/2020   AST 12 05/28/2020   ALT 7 05/28/2020   PROT 6.4 05/28/2020   ALBUMIN 4.2 05/28/2020   CALCIUM 9.2 05/28/2020    Speciality Comments: No specialty comments available.  Procedures:  No procedures performed Allergies: Patient has no known allergies.   Assessment / Plan:     Visit Diagnoses: Chronic pain of both knees -patient complains of pain and discomfort in bilateral  knee joints especially in his right knee joint.  The pain is worse on kneeling and climbing stairs.  He also experiences some discomfort in the right popliteal region.  No warmth swelling or effusion was noted.  Plan: XR KNEE 3 VIEW RIGHT, XR KNEE 3 VIEW LEFT, x-ray showed right knee joint moderate osteoarthritis and moderate chondromalacia patella with some calcification in the popliteal region.  Left knee joint x-rays showed moderate chondromalacia patella.  X-ray findings were reviewed with the patient.  I offered a cortisone injection which she declined.  Use of topical Voltaren gel was discussed.  A handout on lower extremity muscle strengthening exercise was given.  As he is complaining of multiple joint discomfort I will obtain following labs today.  Sedimentation rate, Rheumatoid factor, Cyclic citrul peptide antibody, IgG, Uric acid  Chronic pain of right ankle -he had an injury to his right ankle joint couple of years ago.  He still has some discomfort.  No synovitis was noted on the examination.  Patient states that meloxicam helps with the discomfort.  Plan: XR Ankle 2 Views Right.  X-rays of the ankle joint were unremarkable.  I advised him to try compression socks for stability of the ankle.  Proper fitting shoes were also advised.  Lateral epicondylitis, right elbow-he is of intermittent discomfort in the right elbow which he describes over the right lateral epicondyle region.  Handout on forearm exercises was given.  He was advised to get a tennis elbow brace.  Pain in both hands-he complains of discomfort in his hands.  PIP and DIP thickening was noted.  No synovitis was noted.  Joint protection muscle strengthening was discussed.  A handout on hand exercises was given.  Vitamin D deficiency-he takes over-the-counter vitamin D supplement.  B12 deficiency-he is on vitamin B-12.  Gastroesophageal reflux disease without esophagitis-he takes Dexilant 60 mg p.o. daily.  Orders: Orders  Placed This Encounter  Procedures   XR KNEE 3 VIEW RIGHT   XR KNEE 3 VIEW LEFT   XR Ankle 2 Views Right   Sedimentation rate   Rheumatoid factor   Cyclic citrul peptide antibody, IgG   Uric acid   No orders of the defined types were placed in this encounter.    Follow-Up Instructions: Return if symptoms worsen or fail to improve, for Pain in multiple  joints.   Pollyann Savoy, MD  Note - This record has been created using Animal nutritionist.  Chart creation errors have been sought, but may not always  have been located. Such creation errors do not reflect on  the standard of medical care.

## 2023-08-28 ENCOUNTER — Ambulatory Visit: Payer: BLUE CROSS/BLUE SHIELD

## 2023-08-28 ENCOUNTER — Encounter: Payer: Self-pay | Admitting: Rheumatology

## 2023-08-28 ENCOUNTER — Ambulatory Visit: Payer: BLUE CROSS/BLUE SHIELD | Attending: Rheumatology | Admitting: Rheumatology

## 2023-08-28 ENCOUNTER — Ambulatory Visit (INDEPENDENT_AMBULATORY_CARE_PROVIDER_SITE_OTHER): Payer: BLUE CROSS/BLUE SHIELD

## 2023-08-28 VITALS — BP 121/73 | HR 73 | Resp 14 | Ht 65.5 in | Wt 173.0 lb

## 2023-08-28 DIAGNOSIS — M25561 Pain in right knee: Secondary | ICD-10-CM | POA: Diagnosis not present

## 2023-08-28 DIAGNOSIS — M25571 Pain in right ankle and joints of right foot: Secondary | ICD-10-CM | POA: Diagnosis not present

## 2023-08-28 DIAGNOSIS — M79641 Pain in right hand: Secondary | ICD-10-CM

## 2023-08-28 DIAGNOSIS — M7711 Lateral epicondylitis, right elbow: Secondary | ICD-10-CM

## 2023-08-28 DIAGNOSIS — E559 Vitamin D deficiency, unspecified: Secondary | ICD-10-CM

## 2023-08-28 DIAGNOSIS — M25562 Pain in left knee: Secondary | ICD-10-CM

## 2023-08-28 DIAGNOSIS — M79642 Pain in left hand: Secondary | ICD-10-CM

## 2023-08-28 DIAGNOSIS — G8929 Other chronic pain: Secondary | ICD-10-CM

## 2023-08-28 DIAGNOSIS — K219 Gastro-esophageal reflux disease without esophagitis: Secondary | ICD-10-CM

## 2023-08-28 DIAGNOSIS — E538 Deficiency of other specified B group vitamins: Secondary | ICD-10-CM

## 2023-08-28 NOTE — Patient Instructions (Signed)
Exercises for Elbow and Forearm Elbow and forearm exercises can help you get better after an injury or health problem. Only do the exercises you were told to do. Make sure you know how to do the exercises safely. Follow the steps below. It's normal to feel mild discomfort. Stop if you feel pain or your pain gets worse. Do not start these exercises until told by your health care provider. Range-of-motion exercises These exercises warm up your muscles and joints. They can help your elbow and forearm move better and be more flexible. These exercises are done using the muscles in your injured elbow and forearm. Elbow flexion, active  Hold your left / right arm at your side. Bend your elbow as far as you can using only your arm muscles. Hold this position for __________ seconds. Slowly go back to the starting position. Repeat __________ times. Do this exercise __________ times a day. Elbow extension, active  Hold your left / right arm at your side. Straighten your elbow as much as you can using only your arm muscles. Hold this position for __________ seconds. Slowly go back to the starting position. Repeat __________ times. Do this exercise __________ times a day. Forearm rotation, supination This is an exercise in which you turn your forearm palm-up. Stand or sit with your elbows at your sides. Bend your left / right elbow to a 90-degree angle (right angle). Position your forearm so that your thumb faces the ceiling. Turn your palm up toward the ceiling until you feel a gentle stretch on the inside of your forearm. If told, use your other hand to help turn your forearm farther until you feel a gentle to moderate stretch. Hold this position for __________ seconds. Slowly go back to the starting position. Repeat __________ times. Do this exercise __________ times a day. Forearm rotation, pronation This is an exercise in which you turn your forearm palm-down. Stand or sit with your elbows at  your sides. Bend your left / right elbow to a 90-degree angle. Position your forearm so that your thumb faces the ceiling. Turn your palm down until you feel a gentle stretch on the top of your forearm. If told, use your other hand to help turn your forearm farther until you feel a gentle to moderate stretch. Hold this position for __________ seconds. Slowly go back to the starting position. Repeat __________ times. Do this exercise __________ times a day. Stretching exercises These exercises warm up your muscles and joints. They're done using your healthy elbow and forearm to help stretch the muscles in your injured elbow and forearm. Elbow flexion, active-assisted  Hold your left / right arm at your side. Bend your elbow as much as you can using your left / right arm muscles. Use your other hand to bend your left / right elbow farther. Gently push up on your forearm until you feel a gentle stretch on the back of your elbow. Hold this position for __________ seconds. Slowly go back to the starting position. Repeat __________ times. Do this exercise __________ times a day. Elbow extension, active-assisted  Hold your left / right arm at your side. Straighten your elbow as much as you can using your left / right arm muscles. Use your other hand to straighten the left / right elbow farther. Gently push down on your forearm until you feel a gentle stretch on the inside of your elbow. Hold this position for __________ seconds. Slowly go back to the starting position. Repeat __________ times. Do  this exercise __________ times a day. Passive elbow flexion, supine  Lie on your back. Lift your left / right arm up in the air, bracing it with your other hand. Let your left / right hand slowly lower toward your shoulder. Keep your elbow pointed toward the ceiling. You should feel a gentle stretch along the back of your upper arm and elbow. If told, you may add a small wrist weight or hand weight to  increase the stretch. Hold this position for __________ seconds. Slowly go back to the starting position. Repeat __________ times. Do this exercise __________ times a day. Passive elbow extension, supine  Lie on your back. Make sure you're comfortable and can relax your arm muscles. Place a folded towel under your left / right upper arm so your elbow and shoulder are at the same height. Straighten your left / right arm so your elbow doesn't rest on the bed or towel. Let the weight of your hand stretch your elbow. Keep your arm and chest muscles relaxed. You should feel a stretch on the inside of your elbow. If told, you may add a small wrist weight or hand weight to increase the stretch. Hold this position for __________ seconds. Slowly release the stretch. Repeat __________ times. Do this exercise __________ times a day. Strengthening exercises These exercises build strength and endurance in your elbow and forearm. Endurance is the ability to use your muscles for a long time, even after they get tired. Elbow flexion, isometric  Stand or sit up straight. Bend your left / right elbow in a 90-degree angle. Keep your forearm at the height of your waist. Your thumb should point toward the ceiling. Place your other hand on top of your left / right forearm. Gently push down while you resist with your left / right arm. Push as hard as you can with both arms without causing any pain or movement at your left / right elbow. Hold this position for __________ seconds. Slowly release the tension in both arms. Let your muscles fully relax. Repeat __________ times. Do this exercise __________ times a day. Elbow extension, isometric  Stand or sit up straight. Place your left / right arm so your palm faces your belly and is at the height of your waist. Place your other hand on the underside of your left / right forearm. Gently push up while you resist with your left / right arm. Push as hard as you can  with both arms without causing any pain or movement at your left / right elbow. Hold this position for __________ seconds. Slowly release the tension in both arms. Let your muscles fully relax. Repeat __________ times. Do this exercise __________ times a day. Elbow flexion with forearm palm up  Sit on a firm chair without armrests or stand up. Place your left / right arm at your side with your elbow straight and your palm facing forward. Holding a __________ weight or gripping a rubber exercise band or tubing, bend your elbow to bring your hand toward your shoulder. Hold this position for __________ seconds. Slowly go back to the starting position. Repeat __________ times. Do this exercise __________ times a day. Elbow extension, active  Sit on a firm chair without armrests or stand up. Hold a rubber exercise band or tubing in both hands. Keep your upper arms at your sides. Bring both hands up to your left / right shoulder. Keep your left / right hand just below your other hand. Straighten your left /  right elbow. Keep your other arm still. Hold this position for __________ seconds. Control the resistance of the band or tubing as you go back to the starting position. Repeat __________ times. Do this exercise __________ times a day. Forearm rotation with weight, supination  Sit with your left / right forearm supported on a table. Your elbow should be at waist height and bent at a 90-degree angle. Gently grasp a lightweight hammer. Rest your hand over the edge of the table with your palm facing down. Without moving your left / right elbow, slowly turn your forearm to turn your palm up toward the ceiling. Hold this position for __________ seconds. Slowly go back to the starting position. Repeat __________ times. Do this exercise __________ times a day. Forearm rotation with weight, pronation  Sit with your left / right forearm supported on a table. Keep your elbow below shoulder  height. Gently grasp a lightweight hammer. Rest your hand over the edge of the table with your palm facing up. Without moving your left / right elbow, slowly turn your forearm to turn your palm down toward the floor. Hold this position for __________ seconds. Slowly go back to the starting position. Repeat __________ times. Do this exercise __________ times a day. This information is not intended to replace advice given to you by your health care provider. Make sure you discuss any questions you have with your health care provider. Document Revised: 01/19/2023 Document Reviewed: 01/19/2023 [employer] Patient Education  2024 [employer] Hand Exercises Hand exercises can be helpful for almost anyone. They can strengthen your hands and improve flexibility and movement. The exercises can also increase blood flow to the hands. These results can make your work and daily tasks easier for you. Hand exercises can be especially helpful for people who have joint pain from arthritis or nerve damage from using their hands over and over. These exercises can also help people who injure a hand. Exercises Most of these hand exercises are gentle stretching and motion exercises. It is usually safe to do them often throughout the day. Warming up your hands before exercise may help reduce stiffness. You can do this with gentle massage or by placing your hands in warm water for 10-15 minutes. It is normal to feel some stretching, pulling, tightness, or mild discomfort when you begin new exercises. In time, this will improve. Remember to always be careful and stop right away if you feel sudden, very bad pain or your pain gets worse. You want to get better and be safe. Ask your health care provider which exercises are safe for you. Do exercises exactly as told by your provider and adjust them as told. Do not begin these exercises until told by your provider. Knuckle bend or "claw" fist  Stand or sit with your arm,  hand, and all five fingers pointed straight up. Make sure to keep your wrist straight. Gently bend your fingers down toward your palm until the tips of your fingers are touching your palm. Keep your big knuckle straight and only bend the small knuckles in your fingers. Hold this position for 10 seconds. Straighten your fingers back to your starting position. Repeat this exercise 5-10 times with each hand. Full finger fist  Stand or sit with your arm, hand, and all five fingers pointed straight up. Make sure to keep your wrist straight. Gently bend your fingers into your palm until the tips of your fingers are touching the middle of your palm. Hold this position for  10 seconds. Extend your fingers back to your starting position, stretching every joint fully. Repeat this exercise 5-10 times with each hand. Straight fist  Stand or sit with your arm, hand, and all five fingers pointed straight up. Make sure to keep your wrist straight. Gently bend your fingers at the big knuckle, where your fingers meet your hand, and at the middle knuckle. Keep the knuckle at the tips of your fingers straight and try to touch the bottom of your palm. Hold this position for 10 seconds. Extend your fingers back to your starting position, stretching every joint fully. Repeat this exercise 5-10 times with each hand. Tabletop Ankle Exercises Ask your health care provider which exercises are safe for you. Do exercises exactly as told by your health care provider and adjust them as directed. It is normal to feel mild stretching, pulling, tightness, or discomfort as you do these exercises. Stop right away if you feel sudden pain or your pain gets worse. Do not begin these exercises until told by your health care provider. Stretching and range-of-motion exercises These exercises warm up your muscles and joints. They can help improve the movement and flexibility of your ankle. They may also help to relieve  pain. Dorsiflexion/plantar flexion  Sit with your left / right knee straight or bent. Do not rest your foot on anything. Flex your left / right ankle to tilt the top of your foot toward your shin. This is called dorsiflexion. Hold this position for __________ seconds. Point your toes downward to tilt the top of your foot away from your shin. This is called plantar flexion. Hold this position for __________ seconds. Repeat __________ times. Complete this exercise __________ times a day. Ankle alphabet  Sit with your left / right foot supported at your lower leg. Do not rest your foot on anything. Make sure your foot has room to move freely. Think of your left / right foot as a paintbrush: Move your foot to trace each letter of the alphabet in the air. Keep your hip and knee still while you trace the letters. Make the letters as large as you can without causing or increasing any discomfort. Repeat __________ times. Complete this exercise __________ times a day. Passive ankle dorsiflexion This is an exercise in which something or someone moves your ankle for you. Sit in a chair on a non-carpeted surface. Place your left / right foot on the floor, directly under your left / right knee. Extend your left / right leg for support. Keeping your heel down, slide your left / right foot back toward the chair until you feel a stretch at your ankle or calf. If you do not feel a stretch, slide your buttocks forward to the edge of the chair while keeping your heel down. Hold this stretch for __________ seconds. Repeat __________ times. Complete this exercise __________ times a day. Strengthening exercises These exercises build strength and endurance in your ankle. Endurance is the ability to use your muscles for a long time, even after they get tired. Dorsiflexors These are muscles that lift your foot up. Secure a rubber exercise band or tube to an object, such as a table leg, that will stay still when  the band is pulled. Secure the other end around your left / right foot. Sit on the floor. Face the object with your left / right leg extended. The band or tube should be slightly tense when your foot is relaxed. Slowly flex your left / right ankle and  toes to bring your foot toward your shin. Hold this position for __________ seconds. Slowly return your foot to the starting position, controlling the band as you do. Repeat __________ times. Complete this exercise __________ times a day. Plantar flexors These are muscles that push your foot down. Sit on the floor with your left / right leg extended. Loop a rubber exercise band or tube around the ball of your left / right foot. The ball of your foot is on the walking surface, right under your toes. The band or tube should be slightly tense when your foot is relaxed. Slowly point your toes downward, pushing them away from you. Hold this position for __________ seconds. Slowly release the tension in the band or tube, controlling smoothly until your foot is back in the starting position. Repeat __________ times. Complete this exercise __________ times a day. Towel curls  Sit in a chair on a non-carpeted surface. Put your feet on the floor. Place a towel in front of your feet. If told by your health care provider, add a __________ lb / kg weight to the end of the towel. Keeping your heel on the floor, put your left / right foot on the towel. Pull the towel toward you by grabbing the towel with your toes and curling them under. Keep your heel on the floor. Let your toes relax. Grab the towel again. Keep pulling the towel until it is completely underneath your foot. Repeat __________ times. Complete this exercise __________ times a day. Standing plantar flexion This is an exercise in which you use your toes to lift your body's weight while standing. Stand with your feet shoulder-width apart. Keep your weight spread evenly over the width of your feet  while you rise up on your toes. Use a wall or table to steady yourself if needed, but try not to use it for support. If this exercise is too easy, try these options: Shift your weight toward your left / right leg until you feel challenged. If told by your health care provider, lift your uninjured leg off the floor. Hold this position for __________ seconds. Repeat __________ times. Complete this exercise __________ times a day. Tandem walking  Stand with one foot directly in front of the other. Slowly raise your back foot up, lifting your heel before your toes, and place it directly in front of your other foot. Continue to walk in this heel-to-toe way for __________ or for as long as told by your health care provider. Have a countertop or wall nearby to use if needed to keep your balance, but try not to hold onto anything for support. Repeat __________ times. Complete this exercise __________ times a day. This information is not intended to replace advice given to you by your health care provider. Make sure you discuss any questions you have with your health care provider. Document Revised: 10/05/2021 Document Reviewed: 10/05/2021 [employer] Patient Education  2024 [employer] Stand or sit with your arm, hand, and all five fingers pointed straight up. Make sure to keep your wrist straight. Gently bend your fingers at the big knuckle, where your fingers meet your hand, as far down as you can. Keep the small knuckles in your fingers straight. Think of forming a tabletop with your fingers. Hold this position for 10 seconds. Extend your fingers back to your starting position, stretching every joint fully. Repeat this exercise 5-10 times with each hand. Finger spread  Place your hand flat on a table with your palm  facing down. Make sure your wrist stays straight. Spread your fingers and thumb apart from each other as far as you can until you feel a gentle stretch. Hold this position for 10  seconds. Bring your fingers and thumb tight together again. Hold this position for 10 seconds. Repeat this exercise 5-10 times with each hand. Making circles  Stand or sit with your arm, hand, and all five fingers pointed straight up. Make sure to keep your wrist straight. Make a circle by touching the tip of your thumb to the tip of your index finger. Hold for 10 seconds. Then open your hand wide. Repeat this motion with your thumb and each of your fingers. Repeat this exercise 5-10 times with each hand. Thumb motion  Sit with your forearm resting on a table and your wrist straight. Your thumb should be facing up toward the ceiling. Keep your fingers relaxed as you move your thumb. Lift your thumb up as high as you can toward the ceiling. Hold for 10 seconds. Bend your thumb across your palm as far as you can, reaching the tip of your thumb for the small finger (pinkie) side of your palm. Hold for 10 seconds. Repeat this exercise 5-10 times with each hand. Grip strengthening  Hold a stress ball or other soft ball in the middle of your hand. Slowly increase the pressure, squeezing the ball as much as you can without causing pain. Think of bringing the tips of your fingers into the middle of your palm. All of your finger joints should bend when doing this exercise. Hold your squeeze for 10 seconds, then relax. Repeat this exercise 5-10 times with each hand. Contact a health care provider if: Your hand pain or discomfort gets much worse when you do an exercise. Your hand pain or discomfort does not improve within 2 hours after you exercise. If you have either of these problems, stop doing these exercises right away. Do not do them again unless your provider says that you can. Get help right away if: You develop sudden, severe hand pain or swelling. If this happens, stop doing these exercises right away. Do not do them again unless your provider says that you can. This information is not  intended to replace advice given to you by your health care provider. Make sure you discuss any questions you have with your health care provider. Document Revised: 07/12/2022 Document Reviewed: 07/12/2022 [employer] Patient Education  2024 [employer] Exercises for Chronic Knee Pain Chronic knee pain is pain that lasts longer than 3 months. For most people with chronic knee pain, exercise and weight loss is an important part of treatment. Your health care provider may want you to focus on: Making the muscles that support your knee stronger. This can take pressure off your knee and reduce pain. Preventing knee stiffness. How far you can move your knee, keeping it there or making it farther. Losing weight (if this applies) to take pressure off your knee, lower your risk for injury, and make it easier for you to exercise. Your provider will help you make an exercise program that fits your needs and physical abilities. Below are simple, low-impact exercises you can do at home. Ask your provider or physical therapist how often you should do your exercise program and how many times to repeat each exercise. General safety tips  Get your provider's approval before doing any exercises. Start slowly and stop any time you feel pain. Do not exercise if your knee pain is  flaring up. Warm up first. Stretching a cold muscle can cause an injury. Do 5-10 minutes of easy movement or light stretching before beginning your exercises. Do 5-10 minutes of low-impact activity (like walking or cycling) before starting strengthening exercises. Contact your provider any time you have pain during or after exercising. Exercise can cause discomfort but should not be painful. It is normal to be a little stiff or sore after exercising. Stretching and range-of-motion exercises Front thigh stretch  Stand up straight and support your body by holding on to a chair or resting one hand on a wall. With your legs straight and close  together, bend one knee to lift your heel up toward your butt. Using one hand for support, grab your ankle with your free hand. Pull your foot up closer toward your butt to feel the stretch in front of your thigh. Hold the stretch for 30 seconds. Repeat __________ times. Complete this exercise __________ times a day. Back thigh stretch  Sit on the floor with your back straight and your legs out straight in front of you. Place the palms of your hands on the floor and slide them toward your feet as you bend at the hip. Try to touch your nose to your knees and feel the stretch in the back of your thighs. Hold for 30 seconds. Repeat __________ times. Complete this exercise __________ times a day. Calf stretch  Stand facing a wall. Place the palms of your hands flat against the wall, arms extended, and lean slightly against the wall. Get into a lunge position with one leg bent at the knee and the other leg stretched out straight behind you. Keep both feet facing the wall and increase the bend in your knee while keeping the heel of the other leg flat on the ground. You should feel the stretch in your calf. Hold for 30 seconds. Repeat __________ times. Complete this exercise __________ times a day. Strengthening exercises Straight leg lift  Lie on your back with one knee bent and the other leg out straight. Slowly lift the straight leg without bending the knee. Lift until your foot is about 12 inches (30 cm) off the floor. Hold for 3-5 seconds and slowly lower your leg. Repeat __________ times. Complete this exercise __________ times a day. Single leg dip  Stand between two chairs and put both hands on the backs of the chairs for support. Extend one leg out straight with your body weight resting on the heel of the standing leg. Slowly bend your standing knee to dip your body to the level that is comfortable for you. Hold for 3-5 seconds. Repeat __________ times. Complete this exercise  __________ times a day. Hamstring curls  Stand straight, knees close together, facing the back of a chair. Hold on to the back of a chair with both hands. Keep one leg straight. Bend the other knee while bringing the heel up toward the butt until the knee is bent at a 90-degree angle (right angle). Hold for 3-5 seconds. Repeat __________ times. Complete this exercise __________ times a day. Wall squat  Stand straight with your back, hips, and head against a wall. Step forward one foot at a time with your back still against the wall. Your feet should be 2 feet (61 cm) from the wall at shoulder width. Keeping your back, hips, and head against the wall, slide down the wall to as close to a sitting position as you can get. Hold for 5-10 seconds, then slowly  slide back up. Repeat __________ times. Complete this exercise __________ times a day. Step-ups  Stand in front of a sturdy platform or stool that is about 6 inches (15 cm) high. Slowly step up with your left / right foot, keeping your knee in line with your hip and foot. Do not let your knee bend so far that you cannot see your toes. Hold on to a chair for balance, but do not use it for support. Slowly unlock your knee and lower yourself to the starting position. Repeat __________ times. Complete this exercise __________ times a day. Contact a health care provider if: Your exercises cause pain. Your pain is worse after you exercise. Your pain prevents you from doing your exercises. This information is not intended to replace advice given to you by your health care provider. Make sure you discuss any questions you have with your health care provider. Document Revised: 07/12/2022 Document Reviewed: 07/12/2022 [employer] Patient Education  2024 [employer].

## 2023-08-29 LAB — CYCLIC CITRUL PEPTIDE ANTIBODY, IGG: Cyclic Citrullin Peptide Ab: <16 U

## 2023-08-29 LAB — RHEUMATOID FACTOR: Rheumatoid fact SerPl-aCnc: <10 [IU]/mL (ref <14)

## 2023-08-29 LAB — URIC ACID: Uric Acid, Serum: 6.4 mg/dL (ref 4.0–8.0)

## 2023-08-29 LAB — SEDIMENTATION RATE: Sed Rate: 2 mm/h (ref 0–20)

## 2023-08-30 NOTE — Progress Notes (Signed)
Sed rate (test for inflammation), uric acid (test for gout), rheumatoid factor and anti-CCP (tests for rheumatoid arthritis) are all within normal limits.

## 2024-03-25 ENCOUNTER — Other Ambulatory Visit (HOSPITAL_BASED_OUTPATIENT_CLINIC_OR_DEPARTMENT_OTHER): Payer: Self-pay

## 2024-03-25 MED ORDER — FLUZONE 0.5 ML IM SUSY
0.5000 mL | PREFILLED_SYRINGE | Freq: Once | INTRAMUSCULAR | 0 refills | Status: AC
Start: 2024-03-25 — End: 2024-03-26
  Filled 2024-03-25: qty 0.5, 1d supply, fill #0

## 2024-07-29 ENCOUNTER — Other Ambulatory Visit (HOSPITAL_BASED_OUTPATIENT_CLINIC_OR_DEPARTMENT_OTHER): Payer: Self-pay

## 2024-07-29 MED ORDER — DICLOFENAC SODIUM 1 % EX GEL
Freq: Four times a day (QID) | CUTANEOUS | 1 refills | Status: AC | PRN
  Filled 2024-07-29: qty 100, 7d supply, fill #0

## 2024-07-29 MED ORDER — CLOBETASOL PROPIONATE 0.05 % EX CREA
TOPICAL_CREAM | CUTANEOUS | 2 refills | Status: AC
  Filled 2024-07-29: qty 45, 30d supply, fill #0

## 2024-07-29 MED ORDER — AMOXICILLIN-POT CLAVULANATE 875-125 MG PO TABS
1.0000 | ORAL_TABLET | Freq: Two times a day (BID) | ORAL | 0 refills | Status: AC
  Filled 2024-07-29: qty 14, 7d supply, fill #0

## 2024-08-08 ENCOUNTER — Other Ambulatory Visit (HOSPITAL_BASED_OUTPATIENT_CLINIC_OR_DEPARTMENT_OTHER): Payer: Self-pay
